# Patient Record
Sex: Male | Born: 1945
Health system: Southern US, Community
[De-identification: ages and names within clinical notes are randomized; demographics above are authoritative.]

## PROBLEM LIST (undated history)

## (undated) DIAGNOSIS — I714 Abdominal aortic aneurysm, without rupture, unspecified: Secondary | ICD-10-CM

## (undated) DIAGNOSIS — N200 Calculus of kidney: Secondary | ICD-10-CM

## (undated) DIAGNOSIS — M1990 Unspecified osteoarthritis, unspecified site: Secondary | ICD-10-CM

## (undated) DIAGNOSIS — I10 Essential (primary) hypertension: Secondary | ICD-10-CM

## (undated) DIAGNOSIS — K219 Gastro-esophageal reflux disease without esophagitis: Secondary | ICD-10-CM

## (undated) DIAGNOSIS — C61 Malignant neoplasm of prostate: Secondary | ICD-10-CM

## (undated) DIAGNOSIS — E785 Hyperlipidemia, unspecified: Secondary | ICD-10-CM

## (undated) HISTORY — PX: KNEE SURGERY: SHX244

## (undated) HISTORY — DX: Abdominal aortic aneurysm, without rupture, unspecified: I71.40

## (undated) HISTORY — PX: CHOLECYSTECTOMY: SHX55

## (undated) HISTORY — DX: Abdominal aortic aneurysm, without rupture: I71.4

## (undated) HISTORY — PX: OTHER SURGICAL HISTORY: SHX169

---

## 1997-09-28 ENCOUNTER — Ambulatory Visit (HOSPITAL_BASED_OUTPATIENT_CLINIC_OR_DEPARTMENT_OTHER): Admission: RE | Admit: 1997-09-28 | Discharge: 1997-09-28 | Payer: Self-pay | Admitting: General Surgery

## 2000-05-14 ENCOUNTER — Encounter: Payer: Self-pay | Admitting: Family Medicine

## 2000-05-14 ENCOUNTER — Encounter: Admission: RE | Admit: 2000-05-14 | Discharge: 2000-05-14 | Payer: Self-pay | Admitting: Family Medicine

## 2000-05-17 ENCOUNTER — Encounter: Admission: RE | Admit: 2000-05-17 | Discharge: 2000-05-17 | Payer: Self-pay | Admitting: Family Medicine

## 2000-05-17 ENCOUNTER — Encounter: Payer: Self-pay | Admitting: Family Medicine

## 2001-01-04 ENCOUNTER — Encounter: Payer: Self-pay | Admitting: General Surgery

## 2001-01-06 ENCOUNTER — Observation Stay (HOSPITAL_COMMUNITY): Admission: RE | Admit: 2001-01-06 | Discharge: 2001-01-07 | Payer: Self-pay | Admitting: General Surgery

## 2001-01-06 ENCOUNTER — Encounter (INDEPENDENT_AMBULATORY_CARE_PROVIDER_SITE_OTHER): Payer: Self-pay | Admitting: *Deleted

## 2001-01-06 ENCOUNTER — Encounter: Payer: Self-pay | Admitting: General Surgery

## 2004-12-17 ENCOUNTER — Encounter: Admission: RE | Admit: 2004-12-17 | Discharge: 2004-12-17 | Payer: Self-pay | Admitting: Family Medicine

## 2009-03-04 ENCOUNTER — Encounter: Admission: RE | Admit: 2009-03-04 | Discharge: 2009-03-04 | Payer: Self-pay | Admitting: Family Medicine

## 2010-06-17 ENCOUNTER — Inpatient Hospital Stay (HOSPITAL_BASED_OUTPATIENT_CLINIC_OR_DEPARTMENT_OTHER)
Admission: RE | Admit: 2010-06-17 | Discharge: 2010-06-17 | Disposition: A | Discharge status: Home / Self Care | Payer: Managed Care, Other (non HMO) | Source: Ambulatory Visit | Attending: Cardiology | Admitting: Cardiology

## 2010-06-17 DIAGNOSIS — R9439 Abnormal result of other cardiovascular function study: Secondary | ICD-10-CM | POA: Insufficient documentation

## 2010-06-17 DIAGNOSIS — R0789 Other chest pain: Secondary | ICD-10-CM | POA: Insufficient documentation

## 2010-06-19 NOTE — Procedures (Signed)
NAME:  Scott Hill, Scott Hill              ACCOUNT NO.:  [id number]  MEDICAL RECORD NO.:  [id number]            PATIENT TYPE:  LOCATION:                                 FACILITY:  PHYSICIAN:  Armanda Magic, M.D.          DATE OF BIRTH:  DATE OF PROCEDURE:  06/17/2010 DATE OF DISCHARGE:                           CARDIAC CATHETERIZATION   REFERRING PHYSICIAN:  Dr. Camillo Flaming  PROCEDURES:  Left heart catheterization, coronary angiography, and left ventriculography.  OPERATOR:  Armanda Magic, MD  INDICATIONS:  Chest pain and abnormal nuclear stress test.  COMPLICATIONS:  None.  INTRAVENOUS ACCESS:  Via right femoral artery 4-French sheath.  INTRAVENOUS MEDICATIONS:  Versed 2 mg and fentanyl 25 mcg.  This is a 65 year old male who presented with complaints of episodic chest pain usually lasting 10 minutes described as a toothache sensation for a second at a time along with dyspnea on exertion.  He was found to have a fixed defect in the inferior wall consistent with diaphragmatic attenuation and a small defect in the anterior wall.  It is felt most likely due to soft tissue attenuation, but due to the patient's continued chest pain he now presents for cardiac catheterization.  The patient was brought to Cardiac Catheterization Laboratory in a fasting nonsedated state.  Informed consent was obtained.  The patient was connected to continuous heart rate and pulse oximetry monitoring and intermittent blood pressure monitoring.  The right groin was prepped and draped in a sterile fashion.  Xylocaine 1% was used for local anesthesia.  Using modified Seldinger technique, a 4-French sheath was placed in the right femoral artery.  Under fluoroscopic guidance, a 4- [place] JL-4 catheter was placed in the ascending left coronary artery, but could not adequately engage the artery.  The catheter was exchanged out over a guidewire for a 4-French JL-5 catheter which successfully engaged the left  coronary ostium.  Multiple cine films taken at 30- degree RAO and 40-degree LAO views.  This catheter was exchanged out over a guidewire for a 4-French 3-DRCA catheter which successfully engaged the right coronary ostium..  Multiple cine films were taken at 30-degree RAO and 40-degree LAO views.  This catheter was exchanged out over a guidewire for a 4-French angled pigtail catheter which was placed under fluoroscopic guidance in the left ventricular cavity.  Left ventriculography was performed in 30-degree RAO view using a total of 25 mL of contrast at 12 mL per second.  The catheter was then pulled back across the aortic valve with no significant gradient noted.  At the end of the procedure, all catheters and sheaths were removed.  Manual compression was performed until adequate hemostasis was obtained.  The patient was transferred back to room in stable condition.  RESULTS:  The left main coronary artery is short and widely patent and trifurcates into left anterior descending artery, ramus branch, and left circumflex artery.  The left anterior descending artery is widely patent throughout its course of the apex giving rise to a large diagonal branch which is widely patent.  The ramus is a large vessel which is widely patent  throughout its course and distally bifurcates into 2 daughter branches both of which are widely patent.  The left circumflex is widely patent throughout its course in the AV groove.  It gives rise to a first small obtuse marginal branch which is widely patent and a second large obtuse marginal 2 branch which is widely patent and terminates in a third obtuse marginal branch which is widely patent.  The right coronary artery is widely patent and distally bifurcates into posterior descending artery and posterolateral artery both of which are widely patent.  Left ventriculography shows normal LV function, EF 60%, LV pressure was 33/50 mmHg, aortic pressure  130/72 mmHg, LVEDP 17 mmHg.  ASSESSMENT: 1. Normal coronary arteries. 2. Normal left ventricular function. 3. Noncardiac chest pain.  PLAN:  Discharge to home after IV fluid and bedrest are complete.  We will have the patient follow up with primary doctor for possible treatment of possible GERD symptoms since the patient says the symptoms seem to occur after meals.  He will follow up with my nurse practitioner in 2 weeks for groin check.     Armanda Magic, M.D.     TT/MEDQ  D:  06/17/2010  T:  06/18/2010  Job:  [id number]  Electronically Signed by Armanda Magic M.D. on 06/19/2010 09:33:45 PM

## 2010-08-15 NOTE — Op Note (Signed)
[facility]  Patient:    Scott Hill, Scott Hill Visit Number: [id number] MRN: [id number]          Service Type: SUR Location: [id number] Attending Physician:  Carson Myrtle Proc. Date: 01/06/01 Admit Date:  01/06/2001   CC:         Talmadge Coventry, M.D.   Operative Report  PREOPERATIVE DIAGNOSES: 1. Chronic cholecystolithiasis. 2. Hemangioma of the liver.  POSTOPERATIVE DIAGNOSES: 1. Chronic cholecystolithiasis. 2. Hemangioma of the liver.  OPERATION:  Laparoscopy, laparoscopic cholecystectomy, and operative cholangiogram.  SURGEON:  Timothy E. Earlene Plater, M.D.  ASSISTANT:  Currie Paris, M.D.  ANESTHESIA:  CRNA supervised Dr. Gladis Riffle.  INDICATIONS:  Scott Hill has had known gallstones since February 2002, with minimal symptoms initially and increasing symptoms recently.  Also, he is known to have hemangioma.  He underwent MRI study followed up by MRI and ultrasound of the liver lesions, and they are felt to be hemangioma.  Because of increasing symptoms, he has elected to proceed with laparoscopic cholecystectomy at this time.  DESCRIPTION OF PROCEDURE:  The patient was brought to the operating room, evaluated by anesthesia, taken to the operating room and placed supine. General endotracheal anesthesia administered.  The abdomen was shaved minimally, scrubbed, prepped and draped in the usual fashion.  Marcaine 0.25% with epinephrine was used prior to each incision.  A vertical infraumbilical incision made and the fascia identified, opened vertically, peritoneum entered without complications.  The Hasson catheter sewed in place with a #1 Vicryl through the fascia and the abdomen insufflated.  Peritoneoscopy was carried out.  No findings were made in the general abdomen.  The gallbladder was seen protruding slightly from the top of the liver.  A second 10 mm trocar was placed in the mid epigastrium, two 5 mm trocars in the right  upper quadrant. Appropriate instruments were applied.  The liver was examined, and the larger of the two hemangiomas was seen in the dome of the right lobe of the liver. Pictures were made.  The gallbladder was grasped, placed on tension.  It appeared to be acute or semi-acute, and then it was tense and edematous.  The fatty tissue about the gallbladder was carefully dissected, and the base of the gallbladder was clearly identified.  A single cystic duct was seen entering the gallbladder.  This was dissected free.  A clip was placed near the gallbladder, a small opening made in the cystic duct.  A cholangiogram catheter was passed percutaneously and inserted into the remnant of the cystic duct.  Then using real-time fluoroscopy, a cholangiogram was made showing normal rapid fill of dye into the biliary system and free flow of dye into the duodenum.  We did not see any abnormalities in the biliary tree and no foreign bodies.  I did ask Dr. Sherrie Mustache to review the real-time fluoroscopy.  He did, and agreed that there were no biliary tree abnormalities.  The cholangiogram catheter was removed, and then the remnant of the cystic duct was triply clipped.  It was fully divided.  There was slight tear from the mid portion of the liver where the gallbladder was primarily intrahepatic. The cystic artery at the base of the gallbladder had been clearly dissected and clipped.  It was further dissected now, triply clipped proximally and divided.  The gallbladder was removed from the gallbladder bed with some difficulty, it being intrahepatic.  Cautery was used fairly extensively.  And in the mid portion of the liver bed, there  was a significant bleeder that was identified, clipped, and thoroughly cauterized.  Two small holes were made in the gallbladder during this dissection, and there was leak of bile but no stones.  This was quickly suctioned away.  With satisfactory control of the bleeding in the bed  of the gallbladder, the remainder of the gallbladder was removed from the gallbladder bed.  A piece of Surgicel was placed there and held in place.  The gallbladder was put in the EndoCatch bag and removed from the abdomen.  Further surveillance of the liver bed revealed no evidence of further bleeding.  Copious irrigation was used (2 liters).  The irrigant was now clear.  Again inspection revealed no evidence of complications.  With this, the irrigant, CO2, instruments, and trocars were all removed from the abdomen.  Skin incisions closed with 3-0 Monocryl.  Steri-Strips and dry sterile dressing applied.  Counts correct.  He tolerated it well and was removed to the recovery room in good condition. Attending Physician:  Carson Myrtle DD:  01/06/01 TD:  01/06/01 Job: [id number]

## 2011-10-06 ENCOUNTER — Other Ambulatory Visit: Payer: Self-pay | Admitting: Orthopaedic Surgery

## 2011-11-13 NOTE — Pre-Procedure Instructions (Signed)
20 DEZI SCHANER  11/13/2011   Your procedure is scheduled on:  11-24-2011  Report to [facility] at 5:30 AM.  Call this number if you have problems the morning of surgery: [phone]   Remember:   Do not eat food or drink:After Midnight.      Take these medicines the morning of surgery with A SIP OF WATER: none   Do not wear jewelry, make-up or nail polish.  Do not wear lotions, powders, or perfumes. You may wear deodorant.  Do not shave 48 hours prior to surgery. Men may shave face and neck.  Do not bring valuables to the hospital.  Contacts, dentures or bridgework may not be worn into surgery.  Leave suitcase in the car. After surgery it may be brought to your room.  For patients admitted to the hospital, checkout time is 11:00 AM the day of discharge.      Special Instructions: Incentive Spirometry - Practice and bring it with you on the day of surgery. and CHG Shower Use Special Wash: 1/2 bottle night before surgery and 1/2 bottle morning of surgery.   Please read over the following fact sheets that you were given: Pain Booklet, Blood Transfusion Information, MRSA Information and Surgical Site Infection Prevention

## 2011-11-16 ENCOUNTER — Other Ambulatory Visit: Payer: Self-pay | Admitting: Orthopaedic Surgery

## 2011-11-16 ENCOUNTER — Encounter (HOSPITAL_COMMUNITY): Payer: Self-pay | Admitting: Respiratory Therapy

## 2011-11-16 ENCOUNTER — Encounter (HOSPITAL_COMMUNITY): Payer: Self-pay

## 2011-11-16 ENCOUNTER — Encounter (HOSPITAL_COMMUNITY)
Admission: RE | Admit: 2011-11-16 | Discharge: 2011-11-16 | Disposition: A | Discharge status: Home / Self Care | Payer: Managed Care, Other (non HMO) | Source: Ambulatory Visit | Attending: Orthopaedic Surgery | Admitting: Orthopaedic Surgery

## 2011-11-16 DIAGNOSIS — R9389 Abnormal findings on diagnostic imaging of other specified body structures: Secondary | ICD-10-CM

## 2011-11-16 HISTORY — DX: Hyperlipidemia, unspecified: E78.5

## 2011-11-16 HISTORY — DX: Gastro-esophageal reflux disease without esophagitis: K21.9

## 2011-11-16 HISTORY — DX: Unspecified osteoarthritis, unspecified site: M19.90

## 2011-11-16 HISTORY — DX: Calculus of kidney: N20.0

## 2011-11-16 HISTORY — DX: Essential (primary) hypertension: I10

## 2011-11-16 LAB — URINALYSIS, ROUTINE W REFLEX MICROSCOPIC
Bilirubin Urine: NEGATIVE
Leukocytes, UA: NEGATIVE
Nitrite: NEGATIVE
Specific Gravity, Urine: 1.01 (ref 1.005–1.030)
Urobilinogen, UA: 0.2 mg/dL (ref 0.0–1.0)

## 2011-11-16 LAB — APTT: aPTT: 28 seconds (ref 24–37)

## 2011-11-16 LAB — DIFFERENTIAL
Eosinophils Relative: 1 % (ref 0–5)
Lymphocytes Relative: 18 % (ref 12–46)
Lymphs Abs: 1.4 10*3/uL (ref 0.7–4.0)
Monocytes Absolute: 0.3 10*3/uL (ref 0.1–1.0)
Monocytes Relative: 4 % (ref 3–12)

## 2011-11-16 LAB — BASIC METABOLIC PANEL
CO2: 29 mEq/L (ref 19–32)
Chloride: 102 mEq/L (ref 96–112)
Glucose, Bld: 137 mg/dL — ABNORMAL HIGH (ref 70–99)
Sodium: 140 mEq/L (ref 135–145)

## 2011-11-16 LAB — CBC
HCT: 43.9 % (ref 39.0–52.0)
MCH: 30 pg (ref 26.0–34.0)
MCV: 89 fL (ref 78.0–100.0)
RBC: 4.93 MIL/uL (ref 4.22–5.81)
WBC: 7.6 10*3/uL (ref 4.0–10.5)

## 2011-11-16 LAB — TYPE AND SCREEN: Antibody Screen: NEGATIVE

## 2011-11-16 LAB — ABO/RH: ABO/RH(D): A POS

## 2011-11-17 ENCOUNTER — Other Ambulatory Visit (HOSPITAL_COMMUNITY): Payer: Managed Care, Other (non HMO)

## 2011-11-20 ENCOUNTER — Ambulatory Visit
Admission: RE | Admit: 2011-11-20 | Discharge: 2011-11-20 | Disposition: A | Discharge status: Home / Self Care | Payer: Managed Care, Other (non HMO) | Source: Ambulatory Visit | Attending: Orthopaedic Surgery | Admitting: Orthopaedic Surgery

## 2011-11-20 DIAGNOSIS — R9389 Abnormal findings on diagnostic imaging of other specified body structures: Secondary | ICD-10-CM

## 2011-11-20 MED ORDER — IOHEXOL 300 MG/ML  SOLN: 75.0000 mL | Freq: Once | INTRAMUSCULAR | Status: AC | PRN

## 2011-11-20 NOTE — H&P (Signed)
Scott Hill is an 66 y.o. male.   Chief Complaint: left knee pain HPI: Scott Hill has had at least 3 years of left knee pain.  His pain is worse when he walks and he is now having trouble sleeping at nighttime.  He is also having pain with activities of daily living that limit his ability to be mobile.  He has had several corticosteroid injections.  He has tried over-the-counter anti-inflammatory medications.  These measures have not been helpful.Radiographs:  X-rays were ordered, performed, and interpreted by me today included [20]:  Bone-on-bone end-stage DJD of the left knee.  We have discussed proceeding with a total knee replacement to help eliminate pain and improve function.  Past Medical History  Diagnosis Date  . Hypertension   . Kidney stones   . GERD (gastroesophageal reflux disease)     occasionally  . Arthritis   . Hyperlipemia     Past Surgical History  Procedure Date  . Knee surgery     cartledge removed  . Cholecystectomy   . Fatty tumors     begein,numerous    No family history on file. Social History:  reports that he has quit smoking. His smoking use included Cigarettes. He has a 30 pack-year smoking history. He does not have any smokeless tobacco history on file. He reports that he drinks about 6 ounces of alcohol per week. He reports that he does not use illicit drugs.  Allergies: No Known Allergies  No prescriptions prior to admission  medications: Simvastatin and HCTZ  No results found for this or any previous visit (from the past 48 hour(s)). No results found.  Review of Systems  Constitutional: Negative.   HENT: Negative.   Eyes: Negative.   Respiratory:       Currently undergoing evaluation for lung abnormality as seen on chest x-ray.  Cardiovascular: Negative.   Gastrointestinal: Negative.   Genitourinary: Negative.   Musculoskeletal: Negative.   Skin: Negative.   Neurological: Negative.   Endo/Heme/Allergies: Negative.     Psychiatric/Behavioral: Negative.     There were no vitals taken for this visit. Physical Exam  Constitutional: He appears well-nourished.  HENT:  Head: Atraumatic.  Eyes: EOM are normal.  Neck: Neck supple.  Cardiovascular: Regular rhythm.   Respiratory: Breath sounds normal.  GI: Soft.  Musculoskeletal:       He walks with a slightly altered gait.  Range of motion left knee 0-120.  No significant effusion.  There is significant crepitation to range of motion.  He does have pain medial and laterally on the joint line to palpation.  Good neurovascular status distally.  Neurological: He is alert.  Skin: Skin is warm.  Psychiatric: He has a normal mood and affect.     Assessment/Plan Left knee end-stage DJD Plan: I have reviewed the risk of anesthesia, infection, DVT and death related to this intervention.  We have discussed the hospital stay of 2-3 days and the necessity for postoperative physical therapy.  I have discussed with him the hope that the surgery will relieve his pain and improve his function.  Kasem Mozer R 11/20/2011, 1:00 PM

## 2011-11-23 MED ORDER — CEFAZOLIN SODIUM 10 G IJ SOLR
3.0000 g | INTRAMUSCULAR | Status: AC
  Administered 2011-11-24: 3 g via INTRAVENOUS
  Filled 2011-11-23 (×2): qty 3000

## 2011-11-24 ENCOUNTER — Encounter (HOSPITAL_COMMUNITY): Payer: Self-pay | Admitting: General Practice

## 2011-11-24 ENCOUNTER — Inpatient Hospital Stay (HOSPITAL_COMMUNITY): Payer: Managed Care, Other (non HMO) | Admitting: Anesthesiology

## 2011-11-24 ENCOUNTER — Encounter (HOSPITAL_COMMUNITY)
Admission: RE | Disposition: A | Discharge status: Home / Self Care | Payer: Self-pay | Source: Ambulatory Visit | Attending: Orthopaedic Surgery

## 2011-11-24 ENCOUNTER — Inpatient Hospital Stay (HOSPITAL_COMMUNITY)
Admission: RE | Admit: 2011-11-24 | Discharge: 2011-11-26 | DRG: 470 | Disposition: A | Discharge status: Home / Self Care | Payer: Managed Care, Other (non HMO) | Source: Ambulatory Visit | Attending: Orthopaedic Surgery | Admitting: Orthopaedic Surgery

## 2011-11-24 ENCOUNTER — Encounter (HOSPITAL_COMMUNITY): Payer: Self-pay | Admitting: Anesthesiology

## 2011-11-24 ENCOUNTER — Encounter (HOSPITAL_COMMUNITY): Payer: Self-pay | Admitting: *Deleted

## 2011-11-24 DIAGNOSIS — M1710 Unilateral primary osteoarthritis, unspecified knee: Principal | ICD-10-CM | POA: Diagnosis present

## 2011-11-24 DIAGNOSIS — M1712 Unilateral primary osteoarthritis, left knee: Secondary | ICD-10-CM | POA: Diagnosis present

## 2011-11-24 DIAGNOSIS — I10 Essential (primary) hypertension: Secondary | ICD-10-CM | POA: Diagnosis present

## 2011-11-24 DIAGNOSIS — E785 Hyperlipidemia, unspecified: Secondary | ICD-10-CM | POA: Diagnosis present

## 2011-11-24 DIAGNOSIS — M129 Arthropathy, unspecified: Secondary | ICD-10-CM | POA: Diagnosis present

## 2011-11-24 DIAGNOSIS — K219 Gastro-esophageal reflux disease without esophagitis: Secondary | ICD-10-CM | POA: Diagnosis present

## 2011-11-24 HISTORY — PX: TOTAL KNEE ARTHROPLASTY: SHX125

## 2011-11-24 SURGERY — ARTHROPLASTY, KNEE, TOTAL
Anesthesia: General | Site: Knee | Laterality: Left | Wound class: Clean

## 2011-11-24 MED ORDER — HYDROMORPHONE HCL PF 1 MG/ML IJ SOLN
0.2500 mg | INTRAMUSCULAR | Status: DC | PRN
  Administered 2011-11-24 (×3): 0.5 mg via INTRAVENOUS

## 2011-11-24 MED ORDER — LACTATED RINGERS IV SOLN: INTRAVENOUS | Status: DC

## 2011-11-24 MED ORDER — LIDOCAINE HCL (CARDIAC) 20 MG/ML IV SOLN
INTRAVENOUS | Status: DC | PRN
  Administered 2011-11-24: 100 mg via INTRAVENOUS

## 2011-11-24 MED ORDER — CEFUROXIME SODIUM 1.5 G IJ SOLR
INTRAMUSCULAR | Status: AC
  Filled 2011-11-24: qty 1.5

## 2011-11-24 MED ORDER — METHOCARBAMOL 100 MG/ML IJ SOLN
500.0000 mg | Freq: Four times a day (QID) | INTRAVENOUS | Status: DC | PRN
  Administered 2011-11-24: 500 mg via INTRAVENOUS
  Filled 2011-11-24: qty 5

## 2011-11-24 MED ORDER — PHENOL 1.4 % MT LIQD: 1.0000 | OROMUCOSAL | Status: DC | PRN

## 2011-11-24 MED ORDER — METOCLOPRAMIDE HCL 5 MG/ML IJ SOLN: 5.0000 mg | Freq: Three times a day (TID) | INTRAMUSCULAR | Status: DC | PRN

## 2011-11-24 MED ORDER — FENTANYL CITRATE 0.05 MG/ML IJ SOLN
INTRAMUSCULAR | Status: DC | PRN
  Administered 2011-11-24 (×2): 50 ug via INTRAVENOUS
  Administered 2011-11-24: 100 ug via INTRAVENOUS
  Administered 2011-11-24: 50 ug via INTRAVENOUS

## 2011-11-24 MED ORDER — ALUM & MAG HYDROXIDE-SIMETH 200-200-20 MG/5ML PO SUSP: 30.0000 mL | ORAL | Status: DC | PRN

## 2011-11-24 MED ORDER — HYDROMORPHONE HCL PF 1 MG/ML IJ SOLN
INTRAMUSCULAR | Status: AC
  Filled 2011-11-24: qty 1

## 2011-11-24 MED ORDER — MIDAZOLAM HCL 5 MG/5ML IJ SOLN
INTRAMUSCULAR | Status: DC | PRN
  Administered 2011-11-24: 2 mg via INTRAVENOUS

## 2011-11-24 MED ORDER — PHENYLEPHRINE HCL 10 MG/ML IJ SOLN
INTRAMUSCULAR | Status: DC | PRN
  Administered 2011-11-24: 160 ug via INTRAVENOUS
  Administered 2011-11-24 (×3): 80 ug via INTRAVENOUS

## 2011-11-24 MED ORDER — ONDANSETRON HCL 4 MG/2ML IJ SOLN: 4.0000 mg | Freq: Four times a day (QID) | INTRAMUSCULAR | Status: DC | PRN

## 2011-11-24 MED ORDER — METHOCARBAMOL 500 MG PO TABS
500.0000 mg | ORAL_TABLET | Freq: Four times a day (QID) | ORAL | Status: DC | PRN
  Administered 2011-11-24 – 2011-11-25 (×4): 500 mg via ORAL
  Filled 2011-11-24 (×5): qty 1

## 2011-11-24 MED ORDER — ASPIRIN EC 325 MG PO TBEC
325.0000 mg | DELAYED_RELEASE_TABLET | Freq: Two times a day (BID) | ORAL | Status: DC
  Administered 2011-11-25 – 2011-11-26 (×3): 325 mg via ORAL
  Filled 2011-11-24 (×5): qty 1

## 2011-11-24 MED ORDER — CHLORHEXIDINE GLUCONATE 4 % EX LIQD: 60.0000 mL | Freq: Once | CUTANEOUS | Status: DC

## 2011-11-24 MED ORDER — ONDANSETRON HCL 4 MG PO TABS: 4.0000 mg | ORAL_TABLET | Freq: Four times a day (QID) | ORAL | Status: DC | PRN

## 2011-11-24 MED ORDER — LOSARTAN POTASSIUM-HCTZ 100-25 MG PO TABS: 1.0000 | ORAL_TABLET | Freq: Every day | ORAL | Status: DC

## 2011-11-24 MED ORDER — HYDROCHLOROTHIAZIDE 25 MG PO TABS
25.0000 mg | ORAL_TABLET | Freq: Every day | ORAL | Status: DC
  Administered 2011-11-24 – 2011-11-26 (×3): 25 mg via ORAL
  Filled 2011-11-24 (×3): qty 1

## 2011-11-24 MED ORDER — SENNOSIDES-DOCUSATE SODIUM 8.6-50 MG PO TABS: 1.0000 | ORAL_TABLET | Freq: Every evening | ORAL | Status: DC | PRN

## 2011-11-24 MED ORDER — DIPHENHYDRAMINE HCL 12.5 MG/5ML PO ELIX: 12.5000 mg | ORAL_SOLUTION | ORAL | Status: DC | PRN

## 2011-11-24 MED ORDER — BUPIVACAINE-EPINEPHRINE PF 0.5-1:200000 % IJ SOLN
INTRAMUSCULAR | Status: DC | PRN
  Administered 2011-11-24: 30 mL

## 2011-11-24 MED ORDER — LACTATED RINGERS IV SOLN
INTRAVENOUS | Status: DC | PRN
  Administered 2011-11-24 (×2): via INTRAVENOUS

## 2011-11-24 MED ORDER — ONDANSETRON HCL 4 MG/2ML IJ SOLN
INTRAMUSCULAR | Status: DC | PRN
  Administered 2011-11-24: 4 mg via INTRAVENOUS

## 2011-11-24 MED ORDER — FLEET ENEMA 7-19 GM/118ML RE ENEM: 1.0000 | ENEMA | Freq: Once | RECTAL | Status: AC | PRN

## 2011-11-24 MED ORDER — METOCLOPRAMIDE HCL 10 MG PO TABS: 5.0000 mg | ORAL_TABLET | Freq: Three times a day (TID) | ORAL | Status: DC | PRN

## 2011-11-24 MED ORDER — ACETAMINOPHEN 10 MG/ML IV SOLN
INTRAVENOUS | Status: DC | PRN
  Administered 2011-11-24: 1000 mg via INTRAVENOUS

## 2011-11-24 MED ORDER — CEFUROXIME SODIUM 1.5 G IJ SOLR
INTRAMUSCULAR | Status: DC | PRN
  Administered 2011-11-24: 1.5 g

## 2011-11-24 MED ORDER — ACETAMINOPHEN 10 MG/ML IV SOLN
INTRAVENOUS | Status: AC
  Filled 2011-11-24: qty 100

## 2011-11-24 MED ORDER — ONDANSETRON HCL 4 MG/2ML IJ SOLN: 4.0000 mg | Freq: Once | INTRAMUSCULAR | Status: DC | PRN

## 2011-11-24 MED ORDER — HYDROMORPHONE HCL PF 1 MG/ML IJ SOLN
0.5000 mg | INTRAMUSCULAR | Status: DC | PRN
  Administered 2011-11-24 – 2011-11-25 (×5): 1 mg via INTRAVENOUS
  Filled 2011-11-24 (×4): qty 1

## 2011-11-24 MED ORDER — DOCUSATE SODIUM 100 MG PO CAPS
100.0000 mg | ORAL_CAPSULE | Freq: Two times a day (BID) | ORAL | Status: DC
  Administered 2011-11-24 – 2011-11-26 (×5): 100 mg via ORAL
  Filled 2011-11-24 (×5): qty 1

## 2011-11-24 MED ORDER — PROPOFOL 10 MG/ML IV EMUL
INTRAVENOUS | Status: DC | PRN
  Administered 2011-11-24: 200 mg via INTRAVENOUS

## 2011-11-24 MED ORDER — LOSARTAN POTASSIUM 50 MG PO TABS
100.0000 mg | ORAL_TABLET | Freq: Every day | ORAL | Status: DC
  Administered 2011-11-24 – 2011-11-26 (×3): 100 mg via ORAL
  Filled 2011-11-24 (×3): qty 2

## 2011-11-24 MED ORDER — OXYCODONE-ACETAMINOPHEN 5-325 MG PO TABS
1.0000 | ORAL_TABLET | ORAL | Status: DC | PRN
  Administered 2011-11-24 – 2011-11-26 (×6): 2 via ORAL
  Filled 2011-11-24 (×6): qty 2

## 2011-11-24 MED ORDER — CEFAZOLIN SODIUM-DEXTROSE 2-3 GM-% IV SOLR
2.0000 g | Freq: Four times a day (QID) | INTRAVENOUS | Status: AC
  Administered 2011-11-24 (×2): 2 g via INTRAVENOUS
  Filled 2011-11-24 (×2): qty 50

## 2011-11-24 MED ORDER — MENTHOL 3 MG MT LOZG: 1.0000 | LOZENGE | OROMUCOSAL | Status: DC | PRN

## 2011-11-24 MED ORDER — GLYCOPYRROLATE 0.2 MG/ML IJ SOLN
INTRAMUSCULAR | Status: DC | PRN
  Administered 2011-11-24: 0.2 mg via INTRAVENOUS

## 2011-11-24 MED ORDER — SODIUM CHLORIDE 0.9 % IR SOLN
Status: DC | PRN
  Administered 2011-11-24: 3000 mL

## 2011-11-24 MED ORDER — ACETAMINOPHEN 650 MG RE SUPP: 650.0000 mg | Freq: Four times a day (QID) | RECTAL | Status: DC | PRN

## 2011-11-24 MED ORDER — ACETAMINOPHEN 325 MG PO TABS: 650.0000 mg | ORAL_TABLET | Freq: Four times a day (QID) | ORAL | Status: DC | PRN

## 2011-11-24 MED ORDER — SIMVASTATIN 40 MG PO TABS
40.0000 mg | ORAL_TABLET | Freq: Every evening | ORAL | Status: DC
  Administered 2011-11-24 – 2011-11-25 (×2): 40 mg via ORAL
  Filled 2011-11-24 (×3): qty 1

## 2011-11-24 MED ORDER — LACTATED RINGERS IV SOLN
INTRAVENOUS | Status: DC
  Administered 2011-11-24: 13:00:00 via INTRAVENOUS

## 2011-11-24 SURGICAL SUPPLY — 60 items
AUTOTRANSFUSION W/QD PVC DRAIN (AUTOTRANSFUSION) ×2 IMPLANT
BANDAGE ELASTIC 4 VELCRO ST LF (GAUZE/BANDAGES/DRESSINGS) ×2 IMPLANT
BANDAGE ESMARK 6X9 LF (GAUZE/BANDAGES/DRESSINGS) ×1 IMPLANT
BANDAGE GAUZE ELAST BULKY 4 IN (GAUZE/BANDAGES/DRESSINGS) ×2 IMPLANT
BLADE SAGITTAL 25.0X1.19X90 (BLADE) ×2 IMPLANT
BLADE SURG ROTATE 9660 (MISCELLANEOUS) IMPLANT
BNDG ELASTIC 6X10 VLCR STRL LF (GAUZE/BANDAGES/DRESSINGS) ×2 IMPLANT
BNDG ESMARK 6X9 LF (GAUZE/BANDAGES/DRESSINGS) ×2
BOWL SMART MIX CTS (DISPOSABLE) ×2 IMPLANT
CEMENT HV SMART SET (Cement) ×4 IMPLANT
CLOTH BEACON ORANGE TIMEOUT ST (SAFETY) ×2 IMPLANT
COVER SURGICAL LIGHT HANDLE (MISCELLANEOUS) ×2 IMPLANT
CUFF TOURNIQUET SINGLE 34IN LL (TOURNIQUET CUFF) ×2 IMPLANT
CUFF TOURNIQUET SINGLE 44IN (TOURNIQUET CUFF) IMPLANT
DRAPE EXTREMITY T 121X128X90 (DRAPE) ×2 IMPLANT
DRAPE PROXIMA HALF (DRAPES) ×2 IMPLANT
DRAPE U-SHAPE 47X51 STRL (DRAPES) ×2 IMPLANT
DRSG ADAPTIC 3X8 NADH LF (GAUZE/BANDAGES/DRESSINGS) ×2 IMPLANT
DRSG PAD ABDOMINAL 8X10 ST (GAUZE/BANDAGES/DRESSINGS) ×2 IMPLANT
DURAPREP 26ML APPLICATOR (WOUND CARE) ×2 IMPLANT
ELECT REM PT RETURN 9FT ADLT (ELECTROSURGICAL) ×2
ELECTRODE REM PT RTRN 9FT ADLT (ELECTROSURGICAL) ×1 IMPLANT
EVACUATOR 1/8 PVC DRAIN (DRAIN) ×2 IMPLANT
FACESHIELD LNG OPTICON STERILE (SAFETY) ×4 IMPLANT
GLOVE BIO SURGEON STRL SZ8.5 (GLOVE) ×2 IMPLANT
GLOVE BIOGEL PI IND STRL 8 (GLOVE) ×1 IMPLANT
GLOVE BIOGEL PI IND STRL 8.5 (GLOVE) ×1 IMPLANT
GLOVE BIOGEL PI INDICATOR 8 (GLOVE) ×1
GLOVE BIOGEL PI INDICATOR 8.5 (GLOVE) ×1
GLOVE SS BIOGEL STRL SZ 8 (GLOVE) ×1 IMPLANT
GLOVE SUPERSENSE BIOGEL SZ 8 (GLOVE) ×1
GOWN PREVENTION PLUS XLARGE (GOWN DISPOSABLE) ×2 IMPLANT
GOWN PREVENTION PLUS XXLARGE (GOWN DISPOSABLE) ×2 IMPLANT
GOWN STRL NON-REIN LRG LVL3 (GOWN DISPOSABLE) ×2 IMPLANT
HANDPIECE INTERPULSE COAX TIP (DISPOSABLE) ×1
HOOD PEEL AWAY FACE SHEILD DIS (HOOD) ×2 IMPLANT
IMMOBILIZER KNEE 20 (SOFTGOODS)
IMMOBILIZER KNEE 20 THIGH 36 (SOFTGOODS) IMPLANT
IMMOBILIZER KNEE 22 UNIV (SOFTGOODS) ×2 IMPLANT
IMMOBILIZER KNEE 24 THIGH 36 (MISCELLANEOUS) IMPLANT
IMMOBILIZER KNEE 24 UNIV (MISCELLANEOUS)
KIT BASIN OR (CUSTOM PROCEDURE TRAY) ×2 IMPLANT
KIT ROOM TURNOVER OR (KITS) ×2 IMPLANT
MANIFOLD NEPTUNE II (INSTRUMENTS) ×2 IMPLANT
NS IRRIG 1000ML POUR BTL (IV SOLUTION) ×2 IMPLANT
PACK TOTAL JOINT (CUSTOM PROCEDURE TRAY) ×2 IMPLANT
PAD ARMBOARD 7.5X6 YLW CONV (MISCELLANEOUS) ×4 IMPLANT
SET HNDPC FAN SPRY TIP SCT (DISPOSABLE) ×1 IMPLANT
SPONGE GAUZE 4X4 12PLY (GAUZE/BANDAGES/DRESSINGS) ×2 IMPLANT
STAPLER VISISTAT 35W (STAPLE) ×2 IMPLANT
SUCTION FRAZIER TIP 10 FR DISP (SUCTIONS) IMPLANT
SUT VIC AB 0 CT1 27 (SUTURE) ×2
SUT VIC AB 0 CT1 27XBRD ANBCTR (SUTURE) ×2 IMPLANT
SUT VIC AB 2-0 CT1 27 (SUTURE) ×2
SUT VIC AB 2-0 CT1 TAPERPNT 27 (SUTURE) ×2 IMPLANT
SUT VLOC 180 0 24IN GS25 (SUTURE) ×2 IMPLANT
TOWEL OR 17X24 6PK STRL BLUE (TOWEL DISPOSABLE) ×2 IMPLANT
TOWEL OR 17X26 10 PK STRL BLUE (TOWEL DISPOSABLE) ×2 IMPLANT
TRAY FOLEY CATH 14FR (SET/KITS/TRAYS/PACK) ×2 IMPLANT
WATER STERILE IRR 1000ML POUR (IV SOLUTION) ×6 IMPLANT

## 2011-11-24 NOTE — Anesthesia Procedure Notes (Addendum)
Anesthesia Regional Block:  Femoral nerve block  Pre-Anesthetic Checklist: ,, timeout performed, Correct Patient, Correct Site, Correct Laterality, Correct Procedure, Correct Position, site marked, Risks and benefits discussed,  Surgical consent,  Pre-op evaluation,  At surgeon's request and post-op pain management  Laterality: Left and Lower  Prep: chloraprep       Needles:  Injection technique: Single-shot  Needle Type: Echogenic Needle     Needle Length: 9cm  Needle Gauge: 21    Additional Needles:  Procedures: ultrasound guided Femoral nerve block Narrative:  Start time: 11/24/2011 6:57 AM End time: 11/24/2011 7:10 AM Injection made incrementally with aspirations every 5 mL.  Performed by: Personally  Anesthesiologist: Sheldon Silvan, MD  Additional Notes: Marcaine 0.5% with EPI 1:200000  Femoral nerve block Procedure Name: LMA Insertion Date/Time: 11/24/2011 7:42 AM Performed by: Arlice Colt B Pre-anesthesia Checklist: Patient identified, Emergency Drugs available, Suction available, Patient being monitored and Timeout performed Patient Re-evaluated:Patient Re-evaluated prior to inductionOxygen Delivery Method: Circle system utilized Intubation Type: IV induction Ventilation: Mask ventilation without difficulty LMA: LMA inserted LMA Size: 5.0 Number of attempts: 2 Placement Confirmation: positive ETCO2 and breath sounds checked- equal and bilateral Tube secured with: Tape Dental Injury: Teeth and Oropharynx as per pre-operative assessment

## 2011-11-24 NOTE — Transfer of Care (Signed)
Immediate Anesthesia Transfer of Care Note  Patient: Scott Hill  Procedure(s) Performed: Procedure(s) (LRB): TOTAL KNEE ARTHROPLASTY (Left)  Patient Location: PACU  Anesthesia Type: General  Level of Consciousness: awake, alert  and oriented  Airway & Oxygen Therapy: Patient Spontanous Breathing and Patient connected to nasal cannula oxygen  Post-op Assessment: Report given to PACU RN and Post -op Vital signs reviewed and stable  Post vital signs: Reviewed and stable  Complications: No apparent anesthesia complications

## 2011-11-24 NOTE — Interval H&P Note (Signed)
History and Physical Interval Note:  11/24/2011 7:29 AM  Scott Hill  has presented today for surgery, with the diagnosis of LEFT KNEE DEGENERATIVE JOINT DISEASE  The various methods of treatment have been discussed with the patient and family. After consideration of risks, benefits and other options for treatment, the patient has consented to  Procedure(s) (LRB): TOTAL KNEE ARTHROPLASTY (Left) as a surgical intervention .  The patient's history has been reviewed, patient examined, no change in status, stable for surgery.  I have reviewed the patient's chart and labs.  Questions were answered to the patient's satisfaction.     Florina Glas G

## 2011-11-24 NOTE — Progress Notes (Signed)
UR COMPLETED  

## 2011-11-24 NOTE — Anesthesia Preprocedure Evaluation (Signed)

## 2011-11-24 NOTE — Anesthesia Postprocedure Evaluation (Signed)
Anesthesia Post Note  Patient: Scott Hill  Procedure(s) Performed: Procedure(s) (LRB): TOTAL KNEE ARTHROPLASTY (Left)  Anesthesia type: general  Patient location: PACU  Post pain: Pain level controlled  Post assessment: Patient's Cardiovascular Status Stable  Last Vitals:  Filed Vitals:   11/24/11 1115  BP:   Pulse: 63  Temp:   Resp: 15    Post vital signs: Reviewed and stable  Level of consciousness: sedated  Complications: No apparent anesthesia complications

## 2011-11-24 NOTE — Op Note (Signed)
PREOP DIAGNOSIS: DJD LEFT KNEE POSTOP DIAGNOSIS: DJD LEFT KNEE PROCEDURE: LEFT TKR ANESTHESIA: General and block ATTENDING SURGEON: Idus Rathke G ASSISTANT: Lindwood Qua PA  INDICATIONS FOR PROCEDURE: Scott Hill is a 66 y.o. male who has struggled for a long time with pain due to degenerative arthritis of the left knee.  The patient has failed many conservative non-operative measures and at this point has pain which limits the ability to sleep and walk.  The patient is offered total knee replacement.  Informed operative consent was obtained after discussion of possible risks of anesthesia, infection, neurovascular injury, DVT, and death.  The importance of the post-operative rehabilitation protocol to optimize result was stressed extensively with the patient.  SUMMARY OF FINDINGS AND PROCEDURE:  Scott Hill was taken to the operative suite where under the above anesthesia a left knee replacement was performed.  There were advanced degenerative changes and the bone quality was excellent.  We used the DePuy system and placed size large femur, tibia, 38mm all polyethylene patella, and a size 10mm spacer.  I did include Zinacef antibiotic in the cement.  The patient was admitted for appropriate post-op care to include perioperative antibiotics and mechanical and pharmacologic measures for DVT prophylaxis.  DESCRIPTION OF PROCEDURE:  Scott Hill was taken to the operative suite where the above anesthesia was applied.  The patient was positioned supine and prepped and draped in normal sterile fashion.  An appropriate time out was performed.  After the administration of Kefzol pre-op antibiotic a standard longitudinal incision was made on the anterior knee.  Dissection was carried down to the extensor mechanism.  All appropriate anti-infective measures were used including the pre-operative antibiotic, betadine impregnated drape, and closed hooded exhaust systems for each member of the  surgical team.  A medial parapatellar incision was made in the extensor mechanism and the knee cap flipped and the knee flexed.  Some residual meniscal tissues were removed along with any remaining ACL/PCL tissue.  A guide was placed on the tibia and a flat cut was made on it's superior surface.  An intramedullary guide was placed in the femur and was utilized to make anterior and posterior cuts creating an appropriate flexion gap.  A second intramedullary guide was placed in the femur to make a distal cut properly balancing the knee with an extension gap equal to the flexion gap.  The three bones sized to the above mentioned sizes and the appropriate guides were placed and utilized.  A trial reduction was done and the knee easily came to full extension and the patella tracked well on flexion.  The trial components were removed and all bones were cleaned with pulsatile lavage and then dried thoroughly.  Cement was mixed including antibiotic and was pressurized onto the bones followed by placement of the aforementioned components.  Excess cement was trimmed and pressure was held on the components until the cement had hardened.  The tourniquet was deflated and a small amount of bleeding was controlled with cautery and pressure.  The knee was irrigated thoroughly.  The extensor mechanism was re-approximated with V-loc suture in running fashion.  The knee was flexed and the repair was solid.  The subcutaneous tissues were re-approximated with #0 and #2-0 vicryl and the skin closed with staples.  A sterile dressing was applied.  Intraoperative fluids, EBL, and tourniquet time can be obtained from anesthesia records.  DISPOSITION:  The patient was taken to recovery room in stable condition and admitted for appropriate  post-op care to include peri-operative antibiotic and DVT prophylaxis with mechanical and pharmacologic measures.  Deepak Bless G 11/24/2011, 9:25 AM

## 2011-11-24 NOTE — Progress Notes (Signed)
Orthopedic Tech Progress Note Patient Details:  Scott Hill 1945/09/13 [id number] CPM applied to Left LE with appropriate settings; OHF with trapeze bar applied to bed CPM Left Knee CPM Left Knee: On Left Knee Flexion (Degrees): 60  Left Knee Extension (Degrees): 0    Asia R Thompson 11/24/2011, 10:40 AM

## 2011-11-25 ENCOUNTER — Encounter (HOSPITAL_COMMUNITY): Payer: Self-pay | Admitting: Orthopaedic Surgery

## 2011-11-25 LAB — CBC
Hemoglobin: 12.5 g/dL — ABNORMAL LOW (ref 13.0–17.0)
MCH: 30.3 pg (ref 26.0–34.0)
MCV: 91.3 fL (ref 78.0–100.0)
RBC: 4.12 MIL/uL — ABNORMAL LOW (ref 4.22–5.81)
WBC: 7.7 10*3/uL (ref 4.0–10.5)

## 2011-11-25 LAB — BASIC METABOLIC PANEL
CO2: 30 mEq/L (ref 19–32)
Calcium: 8.9 mg/dL (ref 8.4–10.5)
Chloride: 97 mEq/L (ref 96–112)
Glucose, Bld: 146 mg/dL — ABNORMAL HIGH (ref 70–99)
Sodium: 133 mEq/L — ABNORMAL LOW (ref 135–145)

## 2011-11-25 NOTE — Progress Notes (Signed)
Orthopedic Tech Progress Note Patient Details:  Scott Hill December 04, 1945 [id number]  Patient ID: [name], male   DOB: January 22, 1946, 66 y.o.   MRN: [id number]   [name] 11/25/2011, 3:26 PM Applied by Corinna Gab

## 2011-11-25 NOTE — Evaluation (Addendum)
Physical Therapy Evaluation Patient Details Name: Scott Hill MRN: [id number] DOB: Sep 10, 1945 Today's Date: 11/25/2011 Time:  -     PT Assessment / Plan / Recommendation Clinical Impression  66 y.o. male admitted to [facility] for L TKA, WBAT, no knee brace.  He is POD #1 and is progressing well with gait.       PT Assessment  Patient needs continued PT services    Follow Up Recommendations  Home health PT;Supervision - Intermittent    Barriers to Discharge        Equipment Recommendations  None recommended by PT    Recommendations for Other Services     Frequency 7X/week    Precautions / Restrictions Precautions Precautions: Knee Restrictions LLE Weight Bearing: Weight bearing as tolerated   Pertinent Vitals/Pain Reports 8/10 pain, just received pain meds      Mobility  Transfers Sit to Stand: 5: Supervision Stand to Sit: 5: Supervision Details for Transfer Assistance: supervision for safety  Ambulation/Gait Ambulation/Gait Assistance: 4: Min guard Ambulation Distance (Feet): 80 Feet Assistive device: Rolling walker Ambulation/Gait Assistance Details: min guard assist for several small LOB during gait Gait Pattern: Step-to pattern;Antalgic    Exercises Total Joint Exercises Ankle Circles/Pumps: AROM;Both;10 reps;Supine Quad Sets: AROM;Left;10 reps;Supine Heel Slides: AAROM;Left;10 reps;Supine Hip ABduction/ADduction: AAROM;Left;10 reps;Supine Straight Leg Raises: AAROM;Left;10 reps;Supine   PT Diagnosis: Difficulty walking;Abnormality of gait;Generalized weakness;Acute pain  PT Problem List: Decreased strength;Decreased range of motion;Decreased activity tolerance;Decreased balance;Decreased mobility;Decreased knowledge of use of DME;Pain PT Treatment Interventions: DME instruction;Gait training;Stair training;Functional mobility training;Therapeutic activities;Therapeutic exercise;Balance training;Neuromuscular re-education;Patient/family education   PT  Goals Acute Rehab PT Goals PT Goal Formulation: With patient Time For Goal Achievement: 12/02/11 Potential to Achieve Goals: Good Pt will go Supine/Side to Sit: with modified independence PT Goal: Supine/Side to Sit - Progress: Goal set today Pt will go Sit to Supine/Side: with modified independence PT Goal: Sit to Supine/Side - Progress: Goal set today Pt will go Sit to Stand: with supervision PT Goal: Sit to Stand - Progress: Goal set today Pt will go Stand to Sit: with supervision PT Goal: Stand to Sit - Progress: Goal set today Pt will Ambulate: >150 feet;with supervision;with rolling walker PT Goal: Ambulate - Progress: Goal set today Pt will Go Up / Down Stairs: 1-2 stairs;with min assist;with least restrictive assistive device;with rolling walker PT Goal: Up/Down Stairs - Progress: Goal set today Pt will Perform Home Exercise Program: with supervision, verbal cues required/provided PT Goal: Perform Home Exercise Program - Progress: Goal set today  Visit Information  Last PT Received On: 11/25/11 Assistance Needed: +1    Subjective Data      Prior Functioning  Home Living Lives With: Spouse [name]) Available Help at Discharge: Family;Available 24 hours/day (wife is retired) Type of Home: House Home Access: Stairs to enter Secretary/administrator of Steps: 2 Entrance Stairs-Rails: None Home Layout: One level Bathroom Shower/Tub: Walk-in shower;Door [employer] Toilet: Handicapped height Home Adaptive Equipment: Engineer, drilling - rolling;Crutches Prior Function Level of Independence: Independent Able to Take Stairs?: Yes Driving: Yes Vocation: Full time employment Comments: comm[place] via plane to [place] every week Dominant Hand: Left    Cognition  Overall Cognitive Status: Appears within functional limits for tasks assessed/performed    Extremity/Trunk Assessment     Balance    End of Session PT - End of Session Activity Tolerance: Patient limited by  fatigue;Patient limited by pain Patient left: in chair;with call bell/phone within reach;with family/visitor present Nurse Communication:  (ready for discharge  home with wife)  GP     Lurena Joiner B. Alexio Sroka, PT, DPT [phone]   11/25/2011, 12:40 PM

## 2011-11-25 NOTE — Progress Notes (Signed)
Subjective: 1 Day Post-Op Procedure(s) (LRB): TOTAL KNEE ARTHROPLASTY (Left)  Activity level:  Up with pt  Diet tolerance:  eating Voiding:  ok Patient reports pain as 3 on 0-10 scale.    Objective: Vital signs in last 24 hours: Temp:  [97 F (36.1 C)-98.6 F (37 C)] 98.6 F (37 C) (08/27 2100) Pulse Rate:  [58-90] 67  (08/27 2100) Resp:  [9-21] 18  (08/27 2100) BP: (104-133)/(53-78) 104/53 mmHg (08/27 2100) SpO2:  [93 %-100 %] 99 % (08/27 2100) Weight:  [106.595 kg (235 lb)] 106.595 kg (235 lb) (08/27 1400)  Labs:  Basename 11/25/11 0600  HGB 12.5*    Basename 11/25/11 0600  WBC 7.7  RBC 4.12*  HCT 37.6*  PLT 181    Basename 11/25/11 0600  NA 133*  K 3.5  CL 97  CO2 30  BUN 15  CREATININE 1.19  GLUCOSE 146*  CALCIUM 8.9   No results found for this basename: LABPT:2,INR:2 in the last 72 hours  Physical Exam:  Neurologically intact ABD soft Neurovascular intact Sensation intact distally Intact pulses distally Dorsiflexion/Plantar flexion intact Incision: dressing C/D/I No cellulitis present Compartment soft  Assessment/Plan:  1 Day Post-Op Procedure(s) (LRB): TOTAL KNEE ARTHROPLASTY (Left) Advance diet Up with therapy D/C IV fluids Plan for discharge tomorrow with [facility] therapy    Scott Hill R 11/25/2011, 8:14 AM

## 2011-11-25 NOTE — Anesthesia Postprocedure Evaluation (Signed)
  Anesthesia Post-op Note  Patient: Scott Hill  Procedure(s) Performed: Procedure(s) (LRB): TOTAL KNEE ARTHROPLASTY (Left)  Patient Location: PACU  Anesthesia Type: GA combined with regional for post-op pain  Level of Consciousness: awake, alert  and oriented  Airway and Oxygen Therapy: Patient Spontanous Breathing and Patient connected to nasal cannula oxygen  Post-op Pain: mild  Post-op Assessment: Post-op Vital signs reviewed  Post-op Vital Signs: Reviewed  Complications: No apparent anesthesia complications

## 2011-11-25 NOTE — Evaluation (Signed)
Occupational Therapy Evaluation Patient Details Name: Scott Hill MRN: [id number] DOB: 1945-12-10 Today's Date: 11/25/2011 Time: 4098-1191 OT Time Calculation (min): 22 min  OT Assessment / Plan / Recommendation Clinical Impression  Pt s/p Lt TKA and presents with pain, decr balance as well as decr knowledge of DME, AE and precautions impacting PLOF. Pt will benefit from skilled OT in the acute setting to maximize I with ADL and ADL mobility prior to d/c    OT Assessment  Patient needs continued OT Services    Follow Up Recommendations  No OT follow up;Supervision/Assistance - 24 hour    Barriers to Discharge      Equipment Recommendations  3 in 1 bedside comode    Recommendations for Other Services    Frequency  Min 2X/week    Precautions / Restrictions Precautions Precautions: Knee Restrictions LLE Weight Bearing: Weight bearing as tolerated   Pertinent Vitals/Pain Pt reports 7/10 left knee pain with activity; pre-medicated and repositioned for pain relief    ADL  Eating/Feeding: Simulated;Independent Where Assessed - Eating/Feeding: Chair Grooming: Performed;Min guard Where Assessed - Grooming: Supported standing (hold to sink for balance) Upper Body Bathing: Set up Where Assessed - Upper Body Bathing: Unsupported sitting Lower Body Bathing: Simulated;Minimal assistance Where Assessed - Lower Body Bathing: Unsupported sit to stand Lower Body Dressing: Moderate assistance;Simulated Where Assessed - Lower Body Dressing: Unsupported sit to stand Toilet Transfer: Performed;Min guard Toilet Transfer Method: Sit to Barista: Regular height toilet;Grab bars Toileting - Clothing Manipulation and Hygiene: Performed;Min guard Where Assessed - Engineer, mining and Hygiene: Standing Tub/Shower Transfer: Performed;Minimal assistance Tub/Shower Transfer Method: Ambulating (backward entry) Psychologist, educational: Walk in  shower Equipment Used: Gait belt Transfers/Ambulation Related to ADLs: Pt Min guard A with ambulation throughout room despite moderate amount of pain ADL Comments: Pt and wife educated on AE use for LB dsg/bathing as well as 3n1 use for toilet and shower.     OT Diagnosis: Generalized weakness;Acute pain  OT Problem List: Decreased activity tolerance;Impaired balance (sitting and/or standing);Decreased knowledge of use of DME or AE;Pain;Decreased knowledge of precautions OT Treatment Interventions: Self-care/ADL training;DME and/or AE instruction;Therapeutic activities;Patient/family education;Balance training   OT Goals Acute Rehab OT Goals OT Goal Formulation: With patient/family Time For Goal Achievement: 12/02/11 Potential to Achieve Goals: Good ADL Goals Pt Will Perform Grooming: with modified independence;Standing at sink;Sitting at sink ADL Goal: Grooming - Progress: Goal set today Pt Will Perform Lower Body Bathing: with supervision;Sit to stand in shower ADL Goal: Lower Body Bathing - Progress: Goal set today Pt Will Perform Lower Body Dressing: with min assist;Sit to stand from bed;Sit to stand from chair ADL Goal: Lower Body Dressing - Progress: Goal set today Pt Will Transfer to Toilet: with modified independence;Ambulation;with DME;3-in-1 ADL Goal: Toilet Transfer - Progress: Goal set today Pt Will Perform Toileting - Clothing Manipulation: with modified independence;Standing ADL Goal: Toileting - Clothing Manipulation - Progress: Goal set today Pt Will Perform Tub/Shower Transfer: Shower transfer;with supervision;Ambulation;with DME ADL Goal: Tub/Shower Transfer - Progress: Goal set today  Visit Information  Last OT Received On: 11/25/11 Assistance Needed: +1    Subjective Data  Subjective: Oh you're here to torture me! Patient Stated Goal: Return home with wife   Prior Functioning  Vision/Perception  Home Living Lives With: Spouse [name]) Available Help at  Discharge: Family;Available 24 hours/day Type of Home: House Home Access: Stairs to enter [employer] of Steps: 2 Entrance Stairs-Rails: None Home Layout: One level Bathroom Shower/Tub: Walk-in  shower;Door Bathroom Toilet: Handicapped height Home Adaptive Equipment: Environmental consultant - rolling;Crutches Prior Function Level of Independence: Independent Able to Take Stairs?: Yes Driving: Yes Vocation: Full time employment Comments: flies to[place]from [place] every week for work Communication Communication: No difficulties Dominant Hand: Left      Cognition  Overall Cognitive Status: Appears within functional limits for tasks assessed/performed    Extremity/Trunk Assessment Right Upper Extremity Assessment RUE ROM/Strength/Tone: Within functional levels RUE Sensation: WFL - Light Touch RUE Coordination: WFL - gross/fine motor Left Upper Extremity Assessment LUE ROM/Strength/Tone: Within functional levels LUE Sensation: WFL - Light Touch LUE Coordination: WFL - gross/fine motor   Mobility  Shoulder Instructions  Bed Mobility Bed Mobility: Not assessed Transfers Sit to Stand: 4: Min guard;With armrests;From chair/3-in-1;From toilet Stand to Sit: 4: Min guard;To chair/3-in-1;With armrests;To toilet Details for Transfer Assistance: min guard secondary to incr pain       Exercise     Balance     End of Session OT - End of Session Equipment Utilized During Treatment: Gait belt Activity Tolerance: Patient limited by pain Patient left: in chair;with call bell/phone within reach;with family/visitor present Nurse Communication: Mobility status  GO     [name] 11/25/2011, 2:52 PM

## 2011-11-25 NOTE — Progress Notes (Signed)
PT Cancellation Note  Treatment cancelled today due to patient's refusal to participate.  Pt worked with OT this afternoon and just got into his CPM.  He did not feel up to getting up a third time today.  PT gave exercise packet and will return in AM for more gait and there ex.    Rollene Rotunda Hudsyn Champine, PT, DPT [phone]   11/25/2011, 4:08 PM

## 2011-11-25 NOTE — Progress Notes (Addendum)
Foley removed. IV- NSL

## 2011-11-25 NOTE — Progress Notes (Signed)
Referral received for SNF. Chart reviewed and CSW has spoken with RNCM who indicates that patient is for DC to home with Home Health and possibly DME.  CSW to sign off. Please re-consult if CSW needs arise.  Lorri Frederick. [place] Pugh  [phone]

## 2011-11-26 LAB — CBC
HCT: 34 % — ABNORMAL LOW (ref 39.0–52.0)
MCH: 29.5 pg (ref 26.0–34.0)
MCV: 88.8 fL (ref 78.0–100.0)
Platelets: 158 10*3/uL (ref 150–400)
RBC: 3.83 MIL/uL — ABNORMAL LOW (ref 4.22–5.81)

## 2011-11-26 MED ORDER — ASPIRIN 325 MG PO TBEC: 325.0000 mg | DELAYED_RELEASE_TABLET | Freq: Two times a day (BID) | ORAL | Status: AC

## 2011-11-26 MED ORDER — METHOCARBAMOL 500 MG PO TABS: 500.0000 mg | ORAL_TABLET | Freq: Four times a day (QID) | ORAL | Status: AC | PRN

## 2011-11-26 MED ORDER — OXYCODONE-ACETAMINOPHEN 5-325 MG PO TABS: 1.0000 | ORAL_TABLET | ORAL | Status: AC | PRN

## 2011-11-26 NOTE — Progress Notes (Signed)
Subjective: 2 Days Post-Op Procedure(s) (LRB): TOTAL KNEE ARTHROPLASTY (Left)  Activity level:  OOB and AMB Diet tolerance:  regular Voiding:  Well ini BR Patient reports pain as mild.    Objective: Vital signs in last 24 hours: Temp:  [98 F (36.7 C)-98.6 F (37 C)] 98.3 F (36.8 C) (08/29 1300) Pulse Rate:  [79-91] 91  (08/29 1300) Resp:  [18] 18  (08/29 1300) BP: (103-112)/(48-60) 108/48 mmHg (08/29 1300) SpO2:  [96 %-98 %] 97 % (08/29 1300)  Labs:  Basename 11/26/11 0600 11/25/11 0600  HGB 11.3* 12.5*    Basename 11/26/11 0600 11/25/11 0600  WBC 7.9 7.7  RBC 3.83* 4.12*  HCT 34.0* 37.6*  PLT 158 181    Basename 11/25/11 0600  NA 133*  K 3.5  CL 97  CO2 30  BUN 15  CREATININE 1.19  GLUCOSE 146*  CALCIUM 8.9   No results found for this basename: LABPT:2,INR:2 in the last 72 hours  Physical Exam:  ABD soft Neurovascular intact Sensation intact distally Intact pulses distally Dorsiflexion/Plantar flexion intact Incision: no drainage Compartment soft  Assessment/Plan:  2 Days Post-Op Procedure(s) (LRB): TOTAL KNEE ARTHROPLASTY (Left)  Dressing changed Discharge home with home health Garry Heater 11/26/2011, 1:41 PM

## 2011-11-26 NOTE — Progress Notes (Signed)
CARE MANAGEMENT NOTE 11/26/2011  Patient:  Scott Hill, Scott Hill   Account Number:  [id number]  Date Initiated:  11/26/2011  Documentation initiated by:  Vance Peper  Subjective/Objective Assessment:   66 yr old male s/p left total knee arthroplasty     Action/Plan:   CM spoke with patient and wife regarding home health and DME needs. Choice offered. Patient preoperatively setup with [facility], no changes.Patient has rolling walker, will not need CPM. 3in1 ordered thru [employer].   Anticipated DC Date:  11/26/2011   Anticipated DC Plan:  HOME W HOME HEALTH SERVICES      DC Planning Services  CM consult      [facility] Choice  HOME HEALTH  DURABLE MEDICAL EQUIPMENT   Choice offered to / List presented to:  C-1 Patient   DME arranged  3-N-1      DME agency  [facility]     HH arranged  HH-2 PT      Status of service:  Completed, signed off Medicare Important Message given?   (If response is "NO", the following Medicare IM given date fields will be blank) Date Medicare IM given:   Date Additional Medicare IM given:    Discharge Disposition:  HOME W HOME HEALTH SERVICES  Per UR Regulation:    If discussed at Long Length of Stay Meetings, dates discussed:    Comments:

## 2011-11-26 NOTE — Progress Notes (Signed)
Physical Therapy Treatment Patient Details Name: Scott Hill MRN: [id number] DOB: 1946/02/15 Today's Date: 11/26/2011 Time: 7829-5621 PT Time Calculation (min): 25 min  PT Assessment / Plan / Recommendation Comments on Treatment Session  Pt progressing well with mobility. OK to DC home from PT standpoint, with HHPT. Pt ambulated 150' with RW independently.     Follow Up Recommendations  Home health PT    Barriers to Discharge        Equipment Recommendations       Recommendations for Other Services    Frequency 7X/week   Plan Discharge plan remains appropriate;Frequency remains appropriate    Precautions / Restrictions Precautions Precautions: Knee Restrictions Weight Bearing Restrictions: Yes LLE Weight Bearing: Weight bearing as tolerated   Pertinent Vitals/Pain *"It's ok" when asked if in pain, pt did not rate pain**    Mobility  Bed Mobility Bed Mobility: Sit to Supine Sit to Supine: 6: Modified independent (Device/Increase time) Transfers Transfers: Sit to Stand;Stand to Sit Sit to Stand: From chair/3-in-1;5: Supervision Stand to Sit: To bed;5: Supervision Details for Transfer Assistance: VCs for hand placement, and to back all the way up to bed prior to sitting Ambulation/Gait Ambulation/Gait Assistance: 6: Modified independent (Device/Increase time) Ambulation Distance (Feet): 150 Feet Assistive device: Rolling walker Gait Pattern: Step-through pattern General Gait Details: VCs for L heel strike    Exercises Total Joint Exercises Ankle Circles/Pumps: AROM;Both;10 reps;Supine Quad Sets: AROM;Left;10 reps;Supine Towel Squeeze: AROM;Both;10 reps;Supine Short Arc Quad: AAROM;Left;10 reps;Supine Heel Slides: AAROM;Left;10 reps;Supine Hip ABduction/ADduction: AAROM;Left;10 reps;Supine Straight Leg Raises: AAROM;Left;10 reps;Supine Long Arc Quad: AROM;Left;Seated Knee Flexion: AROM;Left;10 reps;Seated   PT Diagnosis:    PT Problem List:   PT Treatment  Interventions:     PT Goals Acute Rehab PT Goals PT Goal Formulation: With patient Time For Goal Achievement: 12/02/11 Potential to Achieve Goals: Good Pt will go Supine/Side to Sit: with modified independence Pt will go Sit to Supine/Side: with modified independence PT Goal: Sit to Supine/Side - Progress: Met Pt will go Sit to Stand: with supervision PT Goal: Sit to Stand - Progress: Met Pt will go Stand to Sit: with supervision PT Goal: Stand to Sit - Progress: Met Pt will Ambulate: >150 feet;with supervision;with rolling walker PT Goal: Ambulate - Progress: Met Pt will Go Up / Down Stairs: 1-2 stairs;with min assist;with least restrictive assistive device;with rolling walker Pt will Perform Home Exercise Program: with supervision, verbal cues required/provided PT Goal: Perform Home Exercise Program - Progress: Progressing toward goal  Visit Information  Last PT Received On: 11/26/11 Assistance Needed: +1    Subjective Data  Subjective: I tolerate pain pretty well.  Patient Stated Goal: return to working in [place]   Cognition  Overall Cognitive Status: Appears within functional limits for tasks assessed/performed Arousal/Alertness: Awake/alert Orientation Level: Appears intact for tasks assessed Behavior During Session: [facility] for tasks performed    Balance     End of Session PT - End of Session Activity Tolerance: Patient tolerated treatment well Patient left: in bed;with call bell/phone within reach;with family/visitor present Nurse Communication: Mobility status CPM Left Knee CPM Left Knee: Off   GP     Ralene Bathe Kistler 11/26/2011, 11:23 AM [phone]

## 2011-11-26 NOTE — Discharge Summary (Signed)
Patient ID: Scott Hill MRN: [id number] DOB/AGE: 65-Oct-1947 65 y.o.  Admit date: 11/24/2011 Discharge date: 11/26/2011  Admission Diagnoses:  Principal Problem:  *Left knee DJD   Discharge Diagnoses:  Same  Past Medical History  Diagnosis Date  . Hypertension   . GERD (gastroesophageal reflux disease)     occasionally  . Arthritis   . Hyperlipemia   . Kidney stones     Surgeries: Procedure(s): TOTAL KNEE ARTHROPLASTY on 11/24/2011   Consultants:    Discharged Condition: Improved  Hospital Course: JOHANTHAN KNEELAND is an 66 y.o. male who was admitted 11/24/2011 for operative treatment ofLeft knee DJD. Patient has severe unremitting pain that affects sleep, daily activities, and work/hobbies. After pre-op clearance the patient was taken to the operating room on 11/24/2011 and underwent  Procedure(s): TOTAL KNEE ARTHROPLASTY.    Patient was given perioperative antibiotics: Anti-infectives     Start     Dose/Rate Route Frequency Ordered Stop   11/24/11 1400   ceFAZolin (ANCEF) IVPB 2 g/50 mL premix        2 g 100 mL/hr over 30 Minutes Intravenous Every 6 hours 11/24/11 1142 11/24/11 2138   11/24/11 0858   cefUROXime (ZINACEF) injection  Status:  Discontinued          As needed 11/24/11 0858 11/24/11 0933   11/23/11 1459   ceFAZolin (ANCEF) 3 g in dextrose 5 % 50 mL IVPB        3 g 160 mL/hr over 30 Minutes Intravenous 60 min pre-op 11/23/11 1459 11/24/11 0743           Patient was given sequential compression devices, early ambulation, and chemoprophylaxis to prevent DVT. Patient will continue ASA 325 one by mouth twice a day for 2 weeks upon discharge. Patient benefited maximally from hospital stay and there were no complications.    Recent vital signs: Patient Vitals for the past 24 hrs:  BP Temp Pulse Resp SpO2  11/26/11 1300 108/48 mmHg 98.3 F (36.8 C) 91  18  97 %  11/26/11 0638 112/60 mmHg 98.6 F (37 C) 79  18  96 %  2011/12/19 1500 103/55 mmHg 98 F  (36.7 C) 84  18  98 %     Recent laboratory studies:  Basename 11/26/11 0600 2011/12/19 0600  WBC 7.9 7.7  HGB 11.3* 12.5*  HCT 34.0* 37.6*  PLT 158 181  NA -- 133*  K -- 3.5  CL -- 97  CO2 -- 30  BUN -- 15  CREATININE -- 1.19  GLUCOSE -- 146*  INR -- --  CALCIUM -- 8.9     Discharge Medications:   Medication List  As of 11/26/2011  2:08 PM   STOP taking these medications         aspirin 81 MG tablet         TAKE these medications         aspirin 325 MG EC tablet   Take 1 tablet (325 mg total) by mouth 2 (two) times daily.      losartan-hydrochlorothiazide 100-25 MG per tablet   Commonly known as: HYZAAR   Take 1 tablet by mouth daily.      methocarbamol 500 MG tablet   Commonly known as: ROBAXIN   Take 1 tablet (500 mg total) by mouth every 6 (six) hours as needed.      oxyCODONE-acetaminophen 5-325 MG per tablet   Commonly known as: PERCOCET/ROXICET   Take 1-2 tablets by mouth every 4 (four)  hours as needed.      simvastatin 40 MG tablet   Commonly known as: ZOCOR   Take 40 mg by mouth every evening.            Diagnostic Studies: Dg Chest 2 View  11/16/2011  *RADIOLOGY REPORT*  Clinical Data: Preop.  CHEST - 2 VIEW  Comparison: None.  Findings: Trachea is midline.  Heart size normal.  Lungs are mildly hyperinflated.  Biapical pleural thickening, right greater than left.  There is fullness in the right infrahilar region, not well correlated on the lateral view No pleural fluid.  IMPRESSION: Mild fullness in the right infrahilar region, not well correlated on the lateral view.  In this patient with a significant smoking history, CT chest with contrast would be helpful in further evaluation, as clinically indicated. These results will be called to the ordering clinician or representative by the Radiologist Assistant, and communication documented in the PACS Dashboard.   Original Report Authenticated By: Reyes Ivan, M.D. ( 11/16/2011 12:41:35 )    Ct  Chest W Contrast  11/20/2011  *RADIOLOGY REPORT*  Clinical Data: Evaluate abnormal chest radiograph  CT CHEST WITH CONTRAST  Technique:  Multidetector CT imaging of the chest was performed following the standard protocol during bolus administration of intravenous contrast.  Contrast:  75 ml of omni 300  Comparison: CT of the pelvis from 09/16/2002.  Findings:  No enlarged axillary or supraclavicular adenopathy.  No mediastinal or hilar adenopathy.  No pericardial or pleural effusions.  Lungs are clear.  No airspace consolidation.  Mild changes of emphysema.  No suspicious pulmonary nodule or mass.  Review of the visualized bony structures is unremarkable. Limited imaging through the upper abdomen shows diffuse fatty infiltration throughout the liver.  Within the dome of the liver there is an indeterminant hypoechoic mass lesion measuring 5.2 x 3.4 cm, image 45.  Smaller indeterminant hypodensity within the left hepatic lobe measures 1.3 cm.  Previous cholecystectomy. There is a cyst arising from the upper pole the right kidney, which is incompletely visualized.  IMPRESSION:  1.  No suspicious pulmonary parenchymal nodule or mass. 2.  Indeterminant hypoechoic lesion within the dome of the right hepatic lobe measures 5.2 x 3.4 cm. These were present on the examination from 11/16/2002 and most likely benign.   Original Report Authenticated By: Rosealee Albee, M.D.     Disposition: 01-Home or Self Care  Discharge Orders    Future Orders Please Complete By Expires   Diet - low sodium heart healthy      Call MD / Call 911      Comments:   If you experience chest pain or shortness of breath, CALL 911 and be transported to the hospital emergency room.  If you develope a fever above 101 F, pus (white drainage) or increased drainage or redness at the wound, or calf pain, call your surgeon's office.   Constipation Prevention      Comments:   Drink plenty of fluids.  Prune juice may be helpful.  You may use a  stool softener, such as Colace (over the counter) 100 mg twice a day.  Use MiraLax (over the counter) for constipation as needed.   Increase activity slowly as tolerated      Change dressing      Comments:   Change dressing on Saturday or Sunday, then change the dressing daily with sterile 4 x 4 inch gauze dressing and apply a six-inch Ace wrap.  You may clean  the incision with alcohol prior to redressing.     home health care-physical therapy will be provided by [place]  Follow-up Information    Follow up with Velna Ochs, MD in 2 weeks.   Contact information:   [address] [place] [address] [phone]           Signed: Prince Rome 11/26/2011, 2:08 PM

## 2012-05-23 ENCOUNTER — Other Ambulatory Visit: Payer: Self-pay | Admitting: Family Medicine

## 2012-06-06 ENCOUNTER — Ambulatory Visit
Admission: RE | Admit: 2012-06-06 | Discharge: 2012-06-06 | Disposition: A | Discharge status: Home / Self Care | Payer: Managed Care, Other (non HMO) | Source: Ambulatory Visit | Attending: Family Medicine | Admitting: Family Medicine

## 2012-06-06 DIAGNOSIS — Z0000 Encounter for general adult medical examination without abnormal findings: Secondary | ICD-10-CM

## 2013-05-12 ENCOUNTER — Encounter: Payer: Self-pay | Admitting: *Deleted

## 2013-05-12 DIAGNOSIS — E7800 Pure hypercholesterolemia, unspecified: Secondary | ICD-10-CM

## 2013-05-12 DIAGNOSIS — E11319 Type 2 diabetes mellitus with unspecified diabetic retinopathy without macular edema: Secondary | ICD-10-CM

## 2013-05-12 DIAGNOSIS — I10 Essential (primary) hypertension: Secondary | ICD-10-CM

## 2013-05-29 ENCOUNTER — Other Ambulatory Visit: Payer: Self-pay | Admitting: Family Medicine

## 2013-05-29 DIAGNOSIS — I719 Aortic aneurysm of unspecified site, without rupture: Secondary | ICD-10-CM

## 2013-05-30 ENCOUNTER — Encounter (INDEPENDENT_AMBULATORY_CARE_PROVIDER_SITE_OTHER): Payer: Self-pay | Admitting: Surgery

## 2013-06-05 ENCOUNTER — Ambulatory Visit
Admission: RE | Admit: 2013-06-05 | Discharge: 2013-06-05 | Disposition: A | Discharge status: Home / Self Care | Payer: Managed Care, Other (non HMO) | Source: Ambulatory Visit | Attending: Family Medicine | Admitting: Family Medicine

## 2013-06-05 ENCOUNTER — Ambulatory Visit (INDEPENDENT_AMBULATORY_CARE_PROVIDER_SITE_OTHER): Payer: Managed Care, Other (non HMO) | Admitting: Surgery

## 2013-06-05 DIAGNOSIS — I719 Aortic aneurysm of unspecified site, without rupture: Secondary | ICD-10-CM

## 2013-06-19 ENCOUNTER — Ambulatory Visit (INDEPENDENT_AMBULATORY_CARE_PROVIDER_SITE_OTHER): Payer: Managed Care, Other (non HMO) | Admitting: Surgery

## 2013-06-19 ENCOUNTER — Encounter (INDEPENDENT_AMBULATORY_CARE_PROVIDER_SITE_OTHER): Payer: Self-pay | Admitting: Surgery

## 2013-06-19 VITALS — BP 136/80 | HR 75 | Temp 97.5°F | Resp 16 | Ht 69.0 in | Wt 232.0 lb

## 2013-06-19 DIAGNOSIS — D1779 Benign lipomatous neoplasm of other sites: Secondary | ICD-10-CM

## 2013-06-19 DIAGNOSIS — D1720 Benign lipomatous neoplasm of skin and subcutaneous tissue of unspecified limb: Secondary | ICD-10-CM

## 2013-06-19 NOTE — Progress Notes (Signed)
Subjective:     Patient ID: Scott Hill, male   DOB: 03/02/1946, 68 y.o.   MRN: [id number]  HPI This is a pleasant gentleman has been seen in our office in the past for lipomas and cysts. It was many years ago. He is concerned about some new skin lesions that have occurred over the past several years. He has noted discomfort from any of except one on his right arm  Review of Systems     Objective:   Physical Exam On exam, he has what appears to be a sebaceous cyst in the left axilla. On his right upper arm, there is approximately a 3-4 cm lipoma at an old incision. He also has several 1 cm lipomas on his abdominal wall    Assessment:     Multiple lipomas     Plan:     I discussed the diagnosis with him. I again discussed the benign nature. He wants to hold off on any surgical excision. I feel this is reasonable. I will see him back in 6 months unless there is a problem

## 2014-06-04 DIAGNOSIS — R7309 Other abnormal glucose: Secondary | ICD-10-CM | POA: Diagnosis not present

## 2014-06-04 DIAGNOSIS — E669 Obesity, unspecified: Secondary | ICD-10-CM | POA: Diagnosis not present

## 2014-06-04 DIAGNOSIS — Z125 Encounter for screening for malignant neoplasm of prostate: Secondary | ICD-10-CM | POA: Diagnosis not present

## 2014-06-04 DIAGNOSIS — E781 Pure hyperglyceridemia: Secondary | ICD-10-CM | POA: Diagnosis not present

## 2014-06-04 DIAGNOSIS — Z0000 Encounter for general adult medical examination without abnormal findings: Secondary | ICD-10-CM | POA: Diagnosis not present

## 2014-06-04 DIAGNOSIS — I10 Essential (primary) hypertension: Secondary | ICD-10-CM | POA: Diagnosis not present

## 2014-06-04 DIAGNOSIS — Z87891 Personal history of nicotine dependence: Secondary | ICD-10-CM | POA: Diagnosis not present

## 2014-06-04 DIAGNOSIS — Z86010 Personal history of colonic polyps: Secondary | ICD-10-CM | POA: Diagnosis not present

## 2014-06-04 DIAGNOSIS — I719 Aortic aneurysm of unspecified site, without rupture: Secondary | ICD-10-CM | POA: Diagnosis not present

## 2014-08-27 DIAGNOSIS — I10 Essential (primary) hypertension: Secondary | ICD-10-CM | POA: Diagnosis not present

## 2014-09-13 DIAGNOSIS — I10 Essential (primary) hypertension: Secondary | ICD-10-CM | POA: Diagnosis not present

## 2014-12-05 DIAGNOSIS — E781 Pure hyperglyceridemia: Secondary | ICD-10-CM | POA: Diagnosis not present

## 2014-12-05 DIAGNOSIS — E669 Obesity, unspecified: Secondary | ICD-10-CM | POA: Diagnosis not present

## 2014-12-05 DIAGNOSIS — Z23 Encounter for immunization: Secondary | ICD-10-CM | POA: Diagnosis not present

## 2014-12-05 DIAGNOSIS — I10 Essential (primary) hypertension: Secondary | ICD-10-CM | POA: Diagnosis not present

## 2014-12-05 DIAGNOSIS — R5383 Other fatigue: Secondary | ICD-10-CM | POA: Diagnosis not present

## 2014-12-05 DIAGNOSIS — R972 Elevated prostate specific antigen [PSA]: Secondary | ICD-10-CM | POA: Diagnosis not present

## 2014-12-05 DIAGNOSIS — R7309 Other abnormal glucose: Secondary | ICD-10-CM | POA: Diagnosis not present

## 2014-12-28 DIAGNOSIS — H52222 Regular astigmatism, left eye: Secondary | ICD-10-CM | POA: Diagnosis not present

## 2014-12-28 DIAGNOSIS — H02831 Dermatochalasis of right upper eyelid: Secondary | ICD-10-CM | POA: Diagnosis not present

## 2014-12-28 DIAGNOSIS — H25813 Combined forms of age-related cataract, bilateral: Secondary | ICD-10-CM | POA: Diagnosis not present

## 2014-12-28 DIAGNOSIS — H527 Unspecified disorder of refraction: Secondary | ICD-10-CM | POA: Diagnosis not present

## 2014-12-28 DIAGNOSIS — H02834 Dermatochalasis of left upper eyelid: Secondary | ICD-10-CM | POA: Diagnosis not present

## 2015-01-02 DIAGNOSIS — H02834 Dermatochalasis of left upper eyelid: Secondary | ICD-10-CM | POA: Diagnosis not present

## 2015-01-02 DIAGNOSIS — H52222 Regular astigmatism, left eye: Secondary | ICD-10-CM | POA: Diagnosis not present

## 2015-01-02 DIAGNOSIS — H25813 Combined forms of age-related cataract, bilateral: Secondary | ICD-10-CM | POA: Diagnosis not present

## 2015-01-02 DIAGNOSIS — H02831 Dermatochalasis of right upper eyelid: Secondary | ICD-10-CM | POA: Diagnosis not present

## 2015-01-02 DIAGNOSIS — H527 Unspecified disorder of refraction: Secondary | ICD-10-CM | POA: Diagnosis not present

## 2015-01-22 DIAGNOSIS — I10 Essential (primary) hypertension: Secondary | ICD-10-CM | POA: Diagnosis not present

## 2015-01-22 DIAGNOSIS — H52222 Regular astigmatism, left eye: Secondary | ICD-10-CM | POA: Diagnosis not present

## 2015-01-22 DIAGNOSIS — E785 Hyperlipidemia, unspecified: Secondary | ICD-10-CM | POA: Diagnosis not present

## 2015-01-22 DIAGNOSIS — Z87891 Personal history of nicotine dependence: Secondary | ICD-10-CM | POA: Diagnosis not present

## 2015-01-22 DIAGNOSIS — H25811 Combined forms of age-related cataract, right eye: Secondary | ICD-10-CM | POA: Diagnosis not present

## 2015-01-22 DIAGNOSIS — Z79899 Other long term (current) drug therapy: Secondary | ICD-10-CM | POA: Diagnosis not present

## 2015-01-22 DIAGNOSIS — E669 Obesity, unspecified: Secondary | ICD-10-CM | POA: Diagnosis not present

## 2015-01-22 DIAGNOSIS — H25812 Combined forms of age-related cataract, left eye: Secondary | ICD-10-CM | POA: Diagnosis not present

## 2015-01-22 DIAGNOSIS — M1990 Unspecified osteoarthritis, unspecified site: Secondary | ICD-10-CM | POA: Diagnosis not present

## 2015-07-17 DIAGNOSIS — R7309 Other abnormal glucose: Secondary | ICD-10-CM | POA: Diagnosis not present

## 2015-07-17 DIAGNOSIS — E669 Obesity, unspecified: Secondary | ICD-10-CM | POA: Diagnosis not present

## 2015-07-17 DIAGNOSIS — R7303 Prediabetes: Secondary | ICD-10-CM | POA: Diagnosis not present

## 2015-07-17 DIAGNOSIS — Z86010 Personal history of colonic polyps: Secondary | ICD-10-CM | POA: Diagnosis not present

## 2015-07-17 DIAGNOSIS — I10 Essential (primary) hypertension: Secondary | ICD-10-CM | POA: Diagnosis not present

## 2015-07-17 DIAGNOSIS — Z125 Encounter for screening for malignant neoplasm of prostate: Secondary | ICD-10-CM | POA: Diagnosis not present

## 2015-07-17 DIAGNOSIS — Z6830 Body mass index (BMI) 30.0-30.9, adult: Secondary | ICD-10-CM | POA: Diagnosis not present

## 2015-07-17 DIAGNOSIS — E785 Hyperlipidemia, unspecified: Secondary | ICD-10-CM | POA: Diagnosis not present

## 2015-07-17 DIAGNOSIS — I719 Aortic aneurysm of unspecified site, without rupture: Secondary | ICD-10-CM | POA: Diagnosis not present

## 2015-07-17 DIAGNOSIS — R972 Elevated prostate specific antigen [PSA]: Secondary | ICD-10-CM | POA: Diagnosis not present

## 2015-07-17 DIAGNOSIS — Z87891 Personal history of nicotine dependence: Secondary | ICD-10-CM | POA: Diagnosis not present

## 2015-12-24 DIAGNOSIS — Z23 Encounter for immunization: Secondary | ICD-10-CM | POA: Diagnosis not present

## 2016-01-15 DIAGNOSIS — M25652 Stiffness of left hip, not elsewhere classified: Secondary | ICD-10-CM | POA: Diagnosis not present

## 2016-01-15 DIAGNOSIS — Z96652 Presence of left artificial knee joint: Secondary | ICD-10-CM | POA: Diagnosis not present

## 2016-01-15 DIAGNOSIS — Z09 Encounter for follow-up examination after completed treatment for conditions other than malignant neoplasm: Secondary | ICD-10-CM | POA: Diagnosis not present

## 2016-01-17 ENCOUNTER — Other Ambulatory Visit: Payer: Self-pay | Admitting: Family Medicine

## 2016-01-17 DIAGNOSIS — E785 Hyperlipidemia, unspecified: Secondary | ICD-10-CM | POA: Diagnosis not present

## 2016-01-17 DIAGNOSIS — Z6828 Body mass index (BMI) 28.0-28.9, adult: Secondary | ICD-10-CM | POA: Diagnosis not present

## 2016-01-17 DIAGNOSIS — E663 Overweight: Secondary | ICD-10-CM | POA: Diagnosis not present

## 2016-01-17 DIAGNOSIS — I719 Aortic aneurysm of unspecified site, without rupture: Secondary | ICD-10-CM

## 2016-01-17 DIAGNOSIS — Z1159 Encounter for screening for other viral diseases: Secondary | ICD-10-CM | POA: Diagnosis not present

## 2016-01-29 ENCOUNTER — Ambulatory Visit
Admission: RE | Admit: 2016-01-29 | Discharge: 2016-01-29 | Disposition: A | Discharge status: Home / Self Care | Payer: Medicare Other | Source: Ambulatory Visit | Attending: Family Medicine | Admitting: Family Medicine

## 2016-01-29 DIAGNOSIS — I719 Aortic aneurysm of unspecified site, without rupture: Secondary | ICD-10-CM

## 2016-01-29 DIAGNOSIS — I714 Abdominal aortic aneurysm, without rupture: Secondary | ICD-10-CM | POA: Diagnosis not present

## 2016-07-22 DIAGNOSIS — Z86010 Personal history of colonic polyps: Secondary | ICD-10-CM | POA: Diagnosis not present

## 2016-07-22 DIAGNOSIS — R7303 Prediabetes: Secondary | ICD-10-CM | POA: Diagnosis not present

## 2016-07-22 DIAGNOSIS — E669 Obesity, unspecified: Secondary | ICD-10-CM | POA: Diagnosis not present

## 2016-07-22 DIAGNOSIS — I719 Aortic aneurysm of unspecified site, without rupture: Secondary | ICD-10-CM | POA: Diagnosis not present

## 2016-07-22 DIAGNOSIS — Z6830 Body mass index (BMI) 30.0-30.9, adult: Secondary | ICD-10-CM | POA: Diagnosis not present

## 2016-07-22 DIAGNOSIS — R972 Elevated prostate specific antigen [PSA]: Secondary | ICD-10-CM | POA: Diagnosis not present

## 2016-07-22 DIAGNOSIS — Z87891 Personal history of nicotine dependence: Secondary | ICD-10-CM | POA: Diagnosis not present

## 2016-07-22 DIAGNOSIS — Z79899 Other long term (current) drug therapy: Secondary | ICD-10-CM | POA: Diagnosis not present

## 2016-07-22 DIAGNOSIS — E785 Hyperlipidemia, unspecified: Secondary | ICD-10-CM | POA: Diagnosis not present

## 2016-07-27 DIAGNOSIS — R7303 Prediabetes: Secondary | ICD-10-CM | POA: Diagnosis not present

## 2016-07-27 DIAGNOSIS — R972 Elevated prostate specific antigen [PSA]: Secondary | ICD-10-CM | POA: Diagnosis not present

## 2016-07-27 DIAGNOSIS — M25531 Pain in right wrist: Secondary | ICD-10-CM | POA: Diagnosis not present

## 2016-07-27 DIAGNOSIS — Z711 Person with feared health complaint in whom no diagnosis is made: Secondary | ICD-10-CM | POA: Diagnosis not present

## 2016-08-04 DIAGNOSIS — D485 Neoplasm of uncertain behavior of skin: Secondary | ICD-10-CM | POA: Diagnosis not present

## 2016-08-04 DIAGNOSIS — L219 Seborrheic dermatitis, unspecified: Secondary | ICD-10-CM | POA: Diagnosis not present

## 2016-08-04 DIAGNOSIS — L853 Xerosis cutis: Secondary | ICD-10-CM | POA: Diagnosis not present

## 2016-08-26 DIAGNOSIS — M25531 Pain in right wrist: Secondary | ICD-10-CM | POA: Diagnosis not present

## 2016-12-14 DIAGNOSIS — Z23 Encounter for immunization: Secondary | ICD-10-CM | POA: Diagnosis not present

## 2017-01-25 DIAGNOSIS — R972 Elevated prostate specific antigen [PSA]: Secondary | ICD-10-CM | POA: Diagnosis not present

## 2017-01-25 DIAGNOSIS — E669 Obesity, unspecified: Secondary | ICD-10-CM | POA: Diagnosis not present

## 2017-01-25 DIAGNOSIS — E785 Hyperlipidemia, unspecified: Secondary | ICD-10-CM | POA: Diagnosis not present

## 2017-03-09 DIAGNOSIS — L821 Other seborrheic keratosis: Secondary | ICD-10-CM | POA: Diagnosis not present

## 2017-03-09 DIAGNOSIS — D485 Neoplasm of uncertain behavior of skin: Secondary | ICD-10-CM | POA: Diagnosis not present

## 2017-06-28 DIAGNOSIS — N5201 Erectile dysfunction due to arterial insufficiency: Secondary | ICD-10-CM | POA: Diagnosis not present

## 2017-06-28 DIAGNOSIS — N401 Enlarged prostate with lower urinary tract symptoms: Secondary | ICD-10-CM | POA: Diagnosis not present

## 2017-06-28 DIAGNOSIS — N486 Induration penis plastica: Secondary | ICD-10-CM | POA: Diagnosis not present

## 2017-06-28 DIAGNOSIS — R350 Frequency of micturition: Secondary | ICD-10-CM | POA: Diagnosis not present

## 2017-07-28 DIAGNOSIS — Z6832 Body mass index (BMI) 32.0-32.9, adult: Secondary | ICD-10-CM | POA: Diagnosis not present

## 2017-07-28 DIAGNOSIS — E669 Obesity, unspecified: Secondary | ICD-10-CM | POA: Diagnosis not present

## 2017-07-28 DIAGNOSIS — E785 Hyperlipidemia, unspecified: Secondary | ICD-10-CM | POA: Diagnosis not present

## 2017-07-28 DIAGNOSIS — I719 Aortic aneurysm of unspecified site, without rupture: Secondary | ICD-10-CM | POA: Diagnosis not present

## 2017-07-28 DIAGNOSIS — R972 Elevated prostate specific antigen [PSA]: Secondary | ICD-10-CM | POA: Diagnosis not present

## 2017-08-10 DIAGNOSIS — L720 Epidermal cyst: Secondary | ICD-10-CM | POA: Diagnosis not present

## 2017-08-24 DIAGNOSIS — R972 Elevated prostate specific antigen [PSA]: Secondary | ICD-10-CM | POA: Diagnosis not present

## 2017-08-31 DIAGNOSIS — L720 Epidermal cyst: Secondary | ICD-10-CM | POA: Diagnosis not present

## 2017-09-20 DIAGNOSIS — C61 Malignant neoplasm of prostate: Secondary | ICD-10-CM | POA: Diagnosis not present

## 2017-09-20 DIAGNOSIS — D075 Carcinoma in situ of prostate: Secondary | ICD-10-CM | POA: Diagnosis not present

## 2017-09-20 DIAGNOSIS — R972 Elevated prostate specific antigen [PSA]: Secondary | ICD-10-CM | POA: Diagnosis not present

## 2017-09-27 DIAGNOSIS — C61 Malignant neoplasm of prostate: Secondary | ICD-10-CM | POA: Diagnosis not present

## 2017-10-19 DIAGNOSIS — C61 Malignant neoplasm of prostate: Secondary | ICD-10-CM | POA: Diagnosis not present

## 2017-10-19 DIAGNOSIS — N486 Induration penis plastica: Secondary | ICD-10-CM | POA: Diagnosis not present

## 2017-11-16 DIAGNOSIS — N486 Induration penis plastica: Secondary | ICD-10-CM | POA: Diagnosis not present

## 2017-12-08 DIAGNOSIS — M9903 Segmental and somatic dysfunction of lumbar region: Secondary | ICD-10-CM | POA: Diagnosis not present

## 2017-12-08 DIAGNOSIS — M5126 Other intervertebral disc displacement, lumbar region: Secondary | ICD-10-CM | POA: Diagnosis not present

## 2017-12-09 DIAGNOSIS — M9903 Segmental and somatic dysfunction of lumbar region: Secondary | ICD-10-CM | POA: Diagnosis not present

## 2017-12-09 DIAGNOSIS — M5126 Other intervertebral disc displacement, lumbar region: Secondary | ICD-10-CM | POA: Diagnosis not present

## 2017-12-10 DIAGNOSIS — M5126 Other intervertebral disc displacement, lumbar region: Secondary | ICD-10-CM | POA: Diagnosis not present

## 2017-12-10 DIAGNOSIS — M9903 Segmental and somatic dysfunction of lumbar region: Secondary | ICD-10-CM | POA: Diagnosis not present

## 2017-12-14 DIAGNOSIS — M9905 Segmental and somatic dysfunction of pelvic region: Secondary | ICD-10-CM | POA: Diagnosis not present

## 2017-12-14 DIAGNOSIS — M9904 Segmental and somatic dysfunction of sacral region: Secondary | ICD-10-CM | POA: Diagnosis not present

## 2017-12-14 DIAGNOSIS — M9902 Segmental and somatic dysfunction of thoracic region: Secondary | ICD-10-CM | POA: Diagnosis not present

## 2017-12-14 DIAGNOSIS — M9903 Segmental and somatic dysfunction of lumbar region: Secondary | ICD-10-CM | POA: Diagnosis not present

## 2017-12-15 DIAGNOSIS — Z23 Encounter for immunization: Secondary | ICD-10-CM | POA: Diagnosis not present

## 2017-12-15 DIAGNOSIS — M545 Low back pain: Secondary | ICD-10-CM | POA: Diagnosis not present

## 2017-12-17 DIAGNOSIS — M9903 Segmental and somatic dysfunction of lumbar region: Secondary | ICD-10-CM | POA: Diagnosis not present

## 2017-12-17 DIAGNOSIS — M9904 Segmental and somatic dysfunction of sacral region: Secondary | ICD-10-CM | POA: Diagnosis not present

## 2017-12-17 DIAGNOSIS — M9905 Segmental and somatic dysfunction of pelvic region: Secondary | ICD-10-CM | POA: Diagnosis not present

## 2017-12-17 DIAGNOSIS — M9902 Segmental and somatic dysfunction of thoracic region: Secondary | ICD-10-CM | POA: Diagnosis not present

## 2018-01-25 DIAGNOSIS — M9905 Segmental and somatic dysfunction of pelvic region: Secondary | ICD-10-CM | POA: Diagnosis not present

## 2018-01-25 DIAGNOSIS — M9902 Segmental and somatic dysfunction of thoracic region: Secondary | ICD-10-CM | POA: Diagnosis not present

## 2018-01-25 DIAGNOSIS — M9904 Segmental and somatic dysfunction of sacral region: Secondary | ICD-10-CM | POA: Diagnosis not present

## 2018-01-25 DIAGNOSIS — M9903 Segmental and somatic dysfunction of lumbar region: Secondary | ICD-10-CM | POA: Diagnosis not present

## 2018-01-31 DIAGNOSIS — M9903 Segmental and somatic dysfunction of lumbar region: Secondary | ICD-10-CM | POA: Diagnosis not present

## 2018-01-31 DIAGNOSIS — M9902 Segmental and somatic dysfunction of thoracic region: Secondary | ICD-10-CM | POA: Diagnosis not present

## 2018-01-31 DIAGNOSIS — M9904 Segmental and somatic dysfunction of sacral region: Secondary | ICD-10-CM | POA: Diagnosis not present

## 2018-01-31 DIAGNOSIS — M9905 Segmental and somatic dysfunction of pelvic region: Secondary | ICD-10-CM | POA: Diagnosis not present

## 2018-02-01 DIAGNOSIS — E785 Hyperlipidemia, unspecified: Secondary | ICD-10-CM | POA: Diagnosis not present

## 2018-02-01 DIAGNOSIS — N486 Induration penis plastica: Secondary | ICD-10-CM | POA: Diagnosis not present

## 2018-02-01 DIAGNOSIS — Z1389 Encounter for screening for other disorder: Secondary | ICD-10-CM | POA: Diagnosis not present

## 2018-02-01 DIAGNOSIS — Z6831 Body mass index (BMI) 31.0-31.9, adult: Secondary | ICD-10-CM | POA: Diagnosis not present

## 2018-02-01 DIAGNOSIS — Z79899 Other long term (current) drug therapy: Secondary | ICD-10-CM | POA: Diagnosis not present

## 2018-02-01 DIAGNOSIS — Z0000 Encounter for general adult medical examination without abnormal findings: Secondary | ICD-10-CM | POA: Diagnosis not present

## 2018-02-01 DIAGNOSIS — E669 Obesity, unspecified: Secondary | ICD-10-CM | POA: Diagnosis not present

## 2018-02-01 DIAGNOSIS — I719 Aortic aneurysm of unspecified site, without rupture: Secondary | ICD-10-CM | POA: Diagnosis not present

## 2018-02-01 DIAGNOSIS — C61 Malignant neoplasm of prostate: Secondary | ICD-10-CM | POA: Diagnosis not present

## 2018-02-01 DIAGNOSIS — Z86010 Personal history of colonic polyps: Secondary | ICD-10-CM | POA: Diagnosis not present

## 2018-02-11 ENCOUNTER — Other Ambulatory Visit: Payer: Self-pay | Admitting: Urology

## 2018-02-11 DIAGNOSIS — T1590XA Foreign body on external eye, part unspecified, unspecified eye, initial encounter: Secondary | ICD-10-CM

## 2018-02-11 DIAGNOSIS — C61 Malignant neoplasm of prostate: Secondary | ICD-10-CM

## 2018-02-28 ENCOUNTER — Other Ambulatory Visit: Payer: Medicare Other

## 2018-03-07 ENCOUNTER — Other Ambulatory Visit: Payer: Medicare Other

## 2018-03-09 ENCOUNTER — Ambulatory Visit
Admission: RE | Admit: 2018-03-09 | Discharge: 2018-03-09 | Disposition: A | Discharge status: Home / Self Care | Payer: Medicare Other | Source: Ambulatory Visit | Attending: Urology | Admitting: Urology

## 2018-03-09 DIAGNOSIS — Z77018 Contact with and (suspected) exposure to other hazardous metals: Secondary | ICD-10-CM | POA: Diagnosis not present

## 2018-03-09 DIAGNOSIS — C61 Malignant neoplasm of prostate: Secondary | ICD-10-CM | POA: Diagnosis not present

## 2018-03-09 DIAGNOSIS — T1590XA Foreign body on external eye, part unspecified, unspecified eye, initial encounter: Secondary | ICD-10-CM

## 2018-03-09 DIAGNOSIS — R972 Elevated prostate specific antigen [PSA]: Secondary | ICD-10-CM | POA: Diagnosis not present

## 2018-03-09 MED ORDER — GADOBENATE DIMEGLUMINE 529 MG/ML IV SOLN
18.0000 mL | Freq: Once | INTRAVENOUS | Status: AC | PRN
  Administered 2018-03-09: 18 mL via INTRAVENOUS

## 2018-04-15 DIAGNOSIS — C61 Malignant neoplasm of prostate: Secondary | ICD-10-CM | POA: Diagnosis not present

## 2018-04-25 DIAGNOSIS — N401 Enlarged prostate with lower urinary tract symptoms: Secondary | ICD-10-CM | POA: Diagnosis not present

## 2018-04-25 DIAGNOSIS — R350 Frequency of micturition: Secondary | ICD-10-CM | POA: Diagnosis not present

## 2018-04-25 DIAGNOSIS — N5201 Erectile dysfunction due to arterial insufficiency: Secondary | ICD-10-CM | POA: Diagnosis not present

## 2018-04-25 DIAGNOSIS — C61 Malignant neoplasm of prostate: Secondary | ICD-10-CM | POA: Diagnosis not present

## 2018-04-28 ENCOUNTER — Encounter: Payer: Self-pay | Admitting: *Deleted

## 2018-05-11 DIAGNOSIS — H26492 Other secondary cataract, left eye: Secondary | ICD-10-CM | POA: Diagnosis not present

## 2018-05-11 DIAGNOSIS — H527 Unspecified disorder of refraction: Secondary | ICD-10-CM | POA: Diagnosis not present

## 2018-05-11 DIAGNOSIS — H25811 Combined forms of age-related cataract, right eye: Secondary | ICD-10-CM | POA: Diagnosis not present

## 2018-05-11 DIAGNOSIS — H52201 Unspecified astigmatism, right eye: Secondary | ICD-10-CM | POA: Diagnosis not present

## 2018-05-11 DIAGNOSIS — H02834 Dermatochalasis of left upper eyelid: Secondary | ICD-10-CM | POA: Diagnosis not present

## 2018-05-11 DIAGNOSIS — H02831 Dermatochalasis of right upper eyelid: Secondary | ICD-10-CM | POA: Diagnosis not present

## 2018-05-11 DIAGNOSIS — Z961 Presence of intraocular lens: Secondary | ICD-10-CM | POA: Diagnosis not present

## 2018-05-16 ENCOUNTER — Encounter: Payer: Self-pay | Admitting: Radiation Oncology

## 2018-05-16 NOTE — Progress Notes (Signed)
GU Location of Tumor / Histology: prostatic adenocarcinoma  If Prostate Cancer, Gleason Score is (3 + 4) and PSA is (8.15) on 02/03/2018 up from 6.8 in 07/2017. Prostate volume: 72 g. Jasier Calabretta was diagnosed in June 2019 and has been under active surveillance since. Reports he had been told his PSA was elevated before June of 19 but can't recall when.    Biopsies of prostate (if applicable) revealed:    Past/Anticipated interventions by urology, if any: prostate biopsy, active surveillance, MRI fusion biopsy, referral for consideration of radiation therapy  Past/Anticipated interventions by medical oncology, if any: no  Weight changes, if any: no  Bowel/Bladder complaints, if any: IPSS  15. SHIM 22. Denies dysuria or hematuria. Reports occasional urinary leakage related to urinary urgency.  Nausea/Vomiting, if any: no  Pain issues, if any:  Reports sciatica x six months. Reports the sciatica is the worst in the morning when he first gets out of bed. Reports stretching helps relieve the pain.    SAFETY ISSUES:  Prior radiation? no  Pacemaker/ICD? no  Possible current pregnancy? no, male patient  Is the patient on methotrexate? no  Current Complaints / other details:  73 year old male. NKDA. Married. Reports a strong paternal history of cancer.

## 2018-05-17 ENCOUNTER — Other Ambulatory Visit: Payer: Self-pay

## 2018-05-17 ENCOUNTER — Encounter: Payer: Self-pay | Admitting: Radiation Oncology

## 2018-05-17 ENCOUNTER — Ambulatory Visit
Admission: RE | Admit: 2018-05-17 | Discharge: 2018-05-17 | Disposition: A | Discharge status: Home / Self Care | Payer: Medicare Other | Source: Ambulatory Visit | Attending: Radiation Oncology | Admitting: Radiation Oncology

## 2018-05-17 ENCOUNTER — Encounter: Payer: Self-pay | Admitting: Medical Oncology

## 2018-05-17 ENCOUNTER — Telehealth: Payer: Self-pay | Admitting: Medical Oncology

## 2018-05-17 VITALS — BP 161/93 | HR 62 | Temp 97.6°F | Resp 18 | Ht 70.0 in | Wt 218.1 lb

## 2018-05-17 DIAGNOSIS — Z87891 Personal history of nicotine dependence: Secondary | ICD-10-CM | POA: Diagnosis not present

## 2018-05-17 DIAGNOSIS — I10 Essential (primary) hypertension: Secondary | ICD-10-CM | POA: Diagnosis not present

## 2018-05-17 DIAGNOSIS — Z79899 Other long term (current) drug therapy: Secondary | ICD-10-CM | POA: Insufficient documentation

## 2018-05-17 DIAGNOSIS — C61 Malignant neoplasm of prostate: Secondary | ICD-10-CM

## 2018-05-17 DIAGNOSIS — R972 Elevated prostate specific antigen [PSA]: Secondary | ICD-10-CM | POA: Diagnosis not present

## 2018-05-17 HISTORY — DX: Malignant neoplasm of prostate: C61

## 2018-05-17 NOTE — Progress Notes (Signed)
Introduced myself to Scott Hill. [place] and his wife as the prostate nurse navigator and my role. He states Dr. Louis Meckel does not think he is a surgical candidate so he is here to discuss his radiation options. He had questions about seed implant but due to prostate volume, I informed him he may not be a candidate without medication to downsize. He is leaning towards external beam radiation. He will discuss treatment options in detail with Dr. Forrest Moron. I gave them my business card and asked them to call me with any question or concerns. He voiced understanding.

## 2018-05-17 NOTE — Progress Notes (Signed)
See progress note under physician encounter. 

## 2018-05-17 NOTE — Telephone Encounter (Signed)
Scott Hill called asking if having cataract surgery will interfere with his prostate radiation. He is scheduled for surgery 3/12. I informed him it should not interfere. He voiced understanding.

## 2018-05-17 NOTE — Progress Notes (Signed)
Radiation Oncology         [phone] ________________________________  Initial outpatient Consultation  Name: Scott Hill MRN: [id number]  Date: 05/17/2018  DOB: 11/24/1945  WU:[id number], Lattie Haw, MD  Ardis Hughs, MD   REFERRING PHYSICIAN: Ardis Hughs, MD  DIAGNOSIS: 73 y.o. gentleman with Stage T1c adenocarcinoma of the prostate with Gleason score of 3+4, and PSA of 8.15.    ICD-10-CM   1. Primary prostate malignancy ([place]) C61     HISTORY OF PRESENT ILLNESS: Scott Hill is a 74 y.o. male with a diagnosis of prostate cancer. He is an established patient of Dr. Louis Meckel.  He was noted to have an elevated PSA of 6.8 and underwent prostate biopsy on 09/13/2017. This showed Gleason 3+4 in 2 cores and Gleason 3+3 in 2 cores. He opted for active surveillance at the time.  A repeat PSA on 02/03/2018 was further elevated at 8.15. He underwent prostate MRI on 03/09/2018, which revealed a 6 mm PI-RADS 3 lesion in the left posterolateral mid gland. The patient proceeded to MRI fusion biopsy of the prostate on 04/15/2018.  The prostate volume measured 72 cc.  Out of 16 core biopsies, 6 were positive, with 3 out of 4 ROI lesion samples positive.  The maximum Gleason score was 3+4, and this was seen in 3/4 cores from the region of interest (ROI). Gleason 3+3 was also seen in left base lateral, left mid lateral, and left apex lateral.  The patient reviewed the biopsy results with his urologist and he has kindly been referred today for discussion of potential radiation treatment options.  PREVIOUS RADIATION THERAPY: No  PAST MEDICAL HISTORY:  Past Medical History:  Diagnosis Date  . Arthritis   . GERD (gastroesophageal reflux disease)    occasionally  . Hyperlipemia   . Hypertension   . Kidney stones   . Prostate cancer ([place])       PAST SURGICAL HISTORY: Past Surgical History:  Procedure Laterality Date  . CHOLECYSTECTOMY    . fatty tumors     begein,numerous  . KNEE SURGERY      cartledge removed  . TOTAL KNEE ARTHROPLASTY  11/24/2011   left knee  . TOTAL KNEE ARTHROPLASTY  11/24/2011   Procedure: TOTAL KNEE ARTHROPLASTY;  Surgeon: Hessie Dibble, MD;  Location: [facility];  Service: Orthopedics;  Laterality: Left;  with revision tibia    FAMILY HISTORY:  Family History  Problem Relation Age of Onset  . Diabetes Mother   . Cancer - Lung Father   . Diabetes Sister   . Anemia Neg Hx   . Arrhythmia Neg Hx   . Asthma Neg Hx   . Clotting disorder Neg Hx   . Fainting Neg Hx   . Heart attack Neg Hx   . Heart disease Neg Hx   . Heart failure Neg Hx   . Hyperlipidemia Neg Hx   . Hypertension Neg Hx     SOCIAL HISTORY:  Social History   Socioeconomic History  . Marital status: Married    Spouse name: Not on file  . Number of children: 0  . Years of education: Not on file  . Highest education level: Not on file  Occupational History    Comment: retired  Scientific laboratory technician  . Financial resource strain: Not on file  . Food insecurity:    Worry: Not on file    Inability: Not on file  . Transportation needs:    Medical: Not on file  Non-medical: Not on file  Tobacco Use  . Smoking status: Former Smoker    Packs/day: 1.00    Years: 30.00    Pack years: 30.00    Types: Cigarettes    Last attempt to quit: 03/30/1996    Years since quitting: 22.1  . Smokeless tobacco: Never Used  Substance and Sexual Activity  . Alcohol use: Yes    Alcohol/week: 10.0 standard drinks    Types: 10 Glasses of wine per week    Comment: occassional  . Drug use: No  . Sexual activity: Not Currently  Lifestyle  . Physical activity:    Days per week: Not on file    Minutes per session: Not on file  . Stress: Not on file  Relationships  . Social connections:    Talks on phone: Not on file    Gets together: Not on file    Attends religious service: Not on file    Active member of club or organization: Not on file    Attends meetings of clubs or organizations: Not on file      Relationship status: Not on file  . Intimate partner violence:    Fear of current or ex partner: Not on file    Emotionally abused: Not on file    Physically abused: Not on file    Forced sexual activity: Not on file  Other Topics Concern  . Not on file  Social History Narrative   Married 45 years (2020).    ALLERGIES: Patient has no known allergies.  MEDICATIONS:  Current Outpatient Medications  Medication Sig Dispense Refill  . calcium-vitamin D (OSCAL WITH D) 500-200 MG-UNIT tablet Take 1 tablet by mouth.    . simvastatin (ZOCOR) 40 MG tablet Take 40 mg by mouth every evening.     No current facility-administered medications for this encounter.     REVIEW OF SYSTEMS:  On review of systems, the patient reports that he is doing well overall. He denies any chest pain, shortness of breath, cough, fevers, chills, night sweats, unintended weight changes. He denies any bowel disturbances, and denies abdominal pain, nausea or vomiting. He denies any new musculoskeletal or joint aches or pains. He has a history of sciatica the last six months. His IPSS was 15, indicating moderate urinary symptoms. He reports nocturia up to 15 times per night and occasional urinary leakage related to urinary urgency. His SHIM was 22, indicating he does not have erectile dysfunction. A complete review of systems is obtained and is otherwise negative.    PHYSICAL EXAM:  Wt Readings from Last 3 Encounters:  05/17/18 218 lb 2 oz (98.9 kg)  06/19/13 232 lb (105.2 kg)  11/24/11 235 lb (106.6 kg)   Temp Readings from Last 3 Encounters:  05/17/18 97.6 F (36.4 C) (Oral)  06/19/13 97.5 F (36.4 C) (Oral)  11/26/11 98.3 F (36.8 C)   BP Readings from Last 3 Encounters:  05/17/18 (!) 161/93  06/19/13 136/80  11/26/11 (!) 108/48   Pulse Readings from Last 3 Encounters:  05/17/18 62  06/19/13 75  11/26/11 91   Pain Assessment Pain Score: 0-No pain/10  In general this is a well appearing Caucasian  gentleman in no acute distress. He is alert and oriented x4 and appropriate throughout the examination. HEENT reveals that the patient is normocephalic, atraumatic. EOMs are intact. PERRLA. Skin is intact without any evidence of gross lesions. Cardiovascular exam reveals a regular rate and rhythm, no clicks rubs or murmurs are auscultated. Chest  is clear to auscultation bilaterally. Lymphatic assessment is performed and does not reveal any adenopathy in the cervical, supraclavicular, axillary, or inguinal chains. Abdomen has active bowel sounds in all quadrants and is intact. The abdomen is soft, non tender, non distended. Lower extremities are negative for pretibial pitting edema, deep calf tenderness, cyanosis or clubbing.   KPS = 100  100 - Normal; no complaints; no evidence of disease. 90   - Able to carry on normal activity; minor signs or symptoms of disease. 80   - Normal activity with effort; some signs or symptoms of disease. 93   - Cares for self; unable to carry on normal activity or to do active work. 60   - Requires occasional assistance, but is able to care for most of his personal needs. 50   - Requires considerable assistance and frequent medical care. 85   - Disabled; requires special care and assistance. 32   - Severely disabled; hospital admission is indicated although death not imminent. 49   - Very sick; hospital admission necessary; active supportive treatment necessary. 10   - Moribund; fatal processes progressing rapidly. 0     - Dead  Karnofsky DA, Abelmann Liebenthal, Craver LS and Burchenal [facility] [phone]) The use of the nitrogen mustards in the palliative treatment of carcinoma: with particular reference to bronchogenic carcinoma Cancer 1 634-56  LABORATORY DATA:  Lab Results  Component Value Date   WBC 7.9 11/26/2011   HGB 11.3 (L) 11/26/2011   HCT 34.0 (L) 11/26/2011   MCV 88.8 11/26/2011   PLT 158 11/26/2011   Lab Results  Component Value Date   NA 133 (L) 11/25/2011    K 3.5 11/25/2011   CL 97 11/25/2011   CO2 30 11/25/2011   No results found for: ALT, AST, GGT, ALKPHOS, BILITOT   RADIOGRAPHY: No results found.    IMPRESSION/PLAN: 1. 73 y.o. gentleman with Stage T1c adenocarcinoma of the prostate with Gleason Score of 3+4, and PSA of 8.15. We discussed the patient's workup and outlined the nature of prostate cancer in this setting. The patient's T stage, Gleason's score, and PSA put him into the favorable intermediate risk group. Accordingly, he is eligible for a variety of potential treatment options including brachytherapy or 5.5 weeks of external radiation.  We discussed the available radiation techniques, and focused on the details and logistics and delivery. The patient is not an ideal candidate for brachytherapy boost with a prostate volume of 72 g.  Our recommendation is to proceed with a 5-1/2-week course of daily radiotherapy. We discussed and outlined the risks, benefits, short and long-term effects associated with radiation in this setting. We discussed the role of SpaceOAR in reducing the rectal toxicity associated with radiotherapy.   At the end of the conversation the patient is interested in moving forward with 5.5 weeks of external beam therapy. We will contact [facility] to make arrangements for fiducial marker with SpaceOAR placement prior to simulation to reduce rectal toxicity from radiotherapy. He will be scheduled for simulation in the near future. We will share our discussion with Dr. Louis Meckel and move forward with treatment planning in anticipation of beginning IMRT in the near future.  We spent 60 minutes face to face with the patient and more than 50% of that time was spent in counseling and/or coordination of care.    Nicholos Johns, PA-C    Tyler Pita, MD  [facility] Direct Dial: [phone]  Fax: [phone] [email or web address]  Skype  [email or web address]   This document serves as a record of services  personally performed by Tyler Pita, MD and Freeman Caldron, PA-C. It was created on their behalf by Wilburn Mylar, a trained medical scribe. The creation of this record is based on the scribe's personal observations and the provider's statements to them. This document has been checked and approved by the attending provider.

## 2018-05-18 DIAGNOSIS — C61 Malignant neoplasm of prostate: Secondary | ICD-10-CM | POA: Insufficient documentation

## 2018-05-20 DIAGNOSIS — H25811 Combined forms of age-related cataract, right eye: Secondary | ICD-10-CM | POA: Diagnosis not present

## 2018-05-20 DIAGNOSIS — H52201 Unspecified astigmatism, right eye: Secondary | ICD-10-CM | POA: Diagnosis not present

## 2018-05-24 ENCOUNTER — Other Ambulatory Visit: Payer: Self-pay | Admitting: Urology

## 2018-05-24 DIAGNOSIS — C61 Malignant neoplasm of prostate: Secondary | ICD-10-CM

## 2018-05-27 ENCOUNTER — Telehealth: Payer: Self-pay | Admitting: *Deleted

## 2018-05-27 NOTE — Telephone Encounter (Signed)
CALLED PATIENT TO INFORM OF FID. MARKERS AND SPACE OAR- 05-30-18 @ [facility], AND HIS SIM ON 06-07-18 - ARRIVAL TIME - 9:45 AM @[facility] , SPOKE WITH PATIENT AND HE IS AWARE OF THESE APPTS.

## 2018-05-30 DIAGNOSIS — C61 Malignant neoplasm of prostate: Secondary | ICD-10-CM | POA: Diagnosis not present

## 2018-05-31 ENCOUNTER — Encounter: Payer: Self-pay | Admitting: *Deleted

## 2018-06-07 ENCOUNTER — Ambulatory Visit
Admission: RE | Admit: 2018-06-07 | Discharge: 2018-06-07 | Disposition: A | Discharge status: Home / Self Care | Payer: Medicare Other | Source: Ambulatory Visit | Attending: Radiation Oncology | Admitting: Radiation Oncology

## 2018-06-07 DIAGNOSIS — Z510 Encounter for antineoplastic radiation therapy: Secondary | ICD-10-CM | POA: Diagnosis not present

## 2018-06-07 DIAGNOSIS — C61 Malignant neoplasm of prostate: Secondary | ICD-10-CM

## 2018-06-09 DIAGNOSIS — H02831 Dermatochalasis of right upper eyelid: Secondary | ICD-10-CM | POA: Diagnosis not present

## 2018-06-09 DIAGNOSIS — E785 Hyperlipidemia, unspecified: Secondary | ICD-10-CM | POA: Diagnosis not present

## 2018-06-09 DIAGNOSIS — H26492 Other secondary cataract, left eye: Secondary | ICD-10-CM | POA: Diagnosis not present

## 2018-06-09 DIAGNOSIS — H25811 Combined forms of age-related cataract, right eye: Secondary | ICD-10-CM | POA: Insufficient documentation

## 2018-06-09 DIAGNOSIS — H52221 Regular astigmatism, right eye: Secondary | ICD-10-CM | POA: Diagnosis not present

## 2018-06-09 DIAGNOSIS — M1990 Unspecified osteoarthritis, unspecified site: Secondary | ICD-10-CM | POA: Diagnosis not present

## 2018-06-09 DIAGNOSIS — H527 Unspecified disorder of refraction: Secondary | ICD-10-CM | POA: Diagnosis not present

## 2018-06-09 DIAGNOSIS — E669 Obesity, unspecified: Secondary | ICD-10-CM | POA: Diagnosis not present

## 2018-06-09 DIAGNOSIS — C61 Malignant neoplasm of prostate: Secondary | ICD-10-CM | POA: Diagnosis not present

## 2018-06-09 DIAGNOSIS — Z87891 Personal history of nicotine dependence: Secondary | ICD-10-CM | POA: Diagnosis not present

## 2018-06-09 DIAGNOSIS — I10 Essential (primary) hypertension: Secondary | ICD-10-CM | POA: Diagnosis not present

## 2018-06-09 DIAGNOSIS — H02834 Dermatochalasis of left upper eyelid: Secondary | ICD-10-CM | POA: Diagnosis not present

## 2018-06-09 NOTE — Addendum Note (Signed)
Encounter addended by: Tyler Pita, MD on: 06/09/2018 1:25 PM  Actions taken: Clinical Note Signed

## 2018-06-09 NOTE — Progress Notes (Signed)
  Radiation Oncology         [phone]) [phone] ________________________________  Name: Scott Hill MRN: [id number]  Date: 06/07/2018  DOB: 1945/08/14  SIMULATION AND TREATMENT PLANNING NOTE    ICD-10-CM   1. Malignant neoplasm of prostate ([place]) C61     DIAGNOSIS:  73 y.o. gentleman with Stage T1c adenocarcinoma of the prostate with Gleason score of 3+4, and PSA of 8.15  NARRATIVE:  The patient was brought to the [place].  Identity was confirmed.  All relevant records and images related to the planned course of therapy were reviewed.  The patient freely provided informed written consent to proceed with treatment after reviewing the details related to the planned course of therapy. The consent form was witnessed and verified by the simulation staff.  Then, the patient was set-up in a stable reproducible supine position for radiation therapy.  A vacuum lock pillow device was custom fabricated to position his legs in a reproducible immobilized position.  Then, I performed a urethrogram under sterile conditions to identify the prostatic apex.  CT images were obtained.  Surface markings were placed.  The CT images were loaded into the planning software.  Then the prostate target and avoidance structures including the rectum, bladder, bowel and hips were contoured.  Treatment planning then occurred.  The radiation prescription was entered and confirmed.  A total of one complex treatment devices was fabricated. I have requested : Intensity Modulated Radiotherapy (IMRT) is medically necessary for this case for the following reason:  Rectal sparing.Marland Kitchen  PLAN:  The patient will receive 70 Gy in 28 fractions.  ________________________________  Sheral Apley Tammi Klippel, M.D.

## 2018-06-10 DIAGNOSIS — Z510 Encounter for antineoplastic radiation therapy: Secondary | ICD-10-CM | POA: Diagnosis not present

## 2018-06-10 DIAGNOSIS — C61 Malignant neoplasm of prostate: Secondary | ICD-10-CM | POA: Diagnosis not present

## 2018-06-10 DIAGNOSIS — H02834 Dermatochalasis of left upper eyelid: Secondary | ICD-10-CM | POA: Diagnosis not present

## 2018-06-10 DIAGNOSIS — Z961 Presence of intraocular lens: Secondary | ICD-10-CM | POA: Diagnosis not present

## 2018-06-10 DIAGNOSIS — H527 Unspecified disorder of refraction: Secondary | ICD-10-CM | POA: Diagnosis not present

## 2018-06-10 DIAGNOSIS — H02831 Dermatochalasis of right upper eyelid: Secondary | ICD-10-CM | POA: Diagnosis not present

## 2018-06-10 DIAGNOSIS — H26492 Other secondary cataract, left eye: Secondary | ICD-10-CM | POA: Diagnosis not present

## 2018-06-16 ENCOUNTER — Ambulatory Visit
Admission: RE | Admit: 2018-06-16 | Discharge: 2018-06-16 | Disposition: A | Discharge status: Home / Self Care | Payer: Medicare Other | Source: Ambulatory Visit | Attending: Radiation Oncology | Admitting: Radiation Oncology

## 2018-06-16 ENCOUNTER — Encounter: Payer: Self-pay | Admitting: Medical Oncology

## 2018-06-16 ENCOUNTER — Other Ambulatory Visit: Payer: Self-pay

## 2018-06-16 DIAGNOSIS — C61 Malignant neoplasm of prostate: Secondary | ICD-10-CM | POA: Diagnosis not present

## 2018-06-16 DIAGNOSIS — Z510 Encounter for antineoplastic radiation therapy: Secondary | ICD-10-CM | POA: Diagnosis not present

## 2018-06-17 ENCOUNTER — Other Ambulatory Visit: Payer: Self-pay

## 2018-06-17 ENCOUNTER — Ambulatory Visit
Admission: RE | Admit: 2018-06-17 | Discharge: 2018-06-17 | Disposition: A | Discharge status: Home / Self Care | Payer: Medicare Other | Source: Ambulatory Visit | Attending: Radiation Oncology | Admitting: Radiation Oncology

## 2018-06-17 DIAGNOSIS — Z510 Encounter for antineoplastic radiation therapy: Secondary | ICD-10-CM | POA: Diagnosis not present

## 2018-06-17 DIAGNOSIS — C61 Malignant neoplasm of prostate: Secondary | ICD-10-CM | POA: Diagnosis not present

## 2018-06-20 ENCOUNTER — Other Ambulatory Visit: Payer: Self-pay

## 2018-06-20 ENCOUNTER — Ambulatory Visit
Admission: RE | Admit: 2018-06-20 | Discharge: 2018-06-20 | Disposition: A | Discharge status: Home / Self Care | Payer: Medicare Other | Source: Ambulatory Visit | Attending: Radiation Oncology | Admitting: Radiation Oncology

## 2018-06-20 DIAGNOSIS — Z510 Encounter for antineoplastic radiation therapy: Secondary | ICD-10-CM | POA: Diagnosis not present

## 2018-06-20 DIAGNOSIS — C61 Malignant neoplasm of prostate: Secondary | ICD-10-CM | POA: Diagnosis not present

## 2018-06-21 ENCOUNTER — Ambulatory Visit
Admission: RE | Admit: 2018-06-21 | Discharge: 2018-06-21 | Disposition: A | Discharge status: Home / Self Care | Payer: Medicare Other | Source: Ambulatory Visit | Attending: Radiation Oncology | Admitting: Radiation Oncology

## 2018-06-21 ENCOUNTER — Other Ambulatory Visit: Payer: Self-pay

## 2018-06-21 DIAGNOSIS — C61 Malignant neoplasm of prostate: Secondary | ICD-10-CM | POA: Diagnosis not present

## 2018-06-21 DIAGNOSIS — Z510 Encounter for antineoplastic radiation therapy: Secondary | ICD-10-CM | POA: Diagnosis not present

## 2018-06-22 ENCOUNTER — Telehealth: Payer: Self-pay | Admitting: *Deleted

## 2018-06-22 ENCOUNTER — Other Ambulatory Visit: Payer: Self-pay

## 2018-06-22 ENCOUNTER — Ambulatory Visit
Admission: RE | Admit: 2018-06-22 | Discharge: 2018-06-22 | Disposition: A | Discharge status: Home / Self Care | Payer: Medicare Other | Source: Ambulatory Visit | Attending: Radiation Oncology | Admitting: Radiation Oncology

## 2018-06-22 DIAGNOSIS — Z510 Encounter for antineoplastic radiation therapy: Secondary | ICD-10-CM | POA: Diagnosis not present

## 2018-06-22 DIAGNOSIS — C61 Malignant neoplasm of prostate: Secondary | ICD-10-CM | POA: Diagnosis not present

## 2018-06-22 NOTE — Telephone Encounter (Signed)
CALLED PATIENT TO INFORM THAT MRI HAS BEEN CANCELLED PER DR. MANNING REQUEST, NOTIFIED THAT WHEN SIM HAPPENED THAT THEY WAS ABLE TO SEE SPACE OAR AND THEREFORE MRI ISN'T NEEDED, PATIENT VERIFIED UNDERSTANDING THIS

## 2018-06-22 NOTE — Telephone Encounter (Signed)
CALLED PATIENT TO INFORM OF MRI FOR 06-27-18 - ARRIVAL TIME - 8:30 AM @ [facility], NO RESTRICTIONS TO TEST, SPOKE WITH PATIENT AND HE IS AWARE OF THIS TEST

## 2018-06-23 ENCOUNTER — Ambulatory Visit
Admission: RE | Admit: 2018-06-23 | Discharge: 2018-06-23 | Disposition: A | Discharge status: Home / Self Care | Payer: Medicare Other | Source: Ambulatory Visit | Attending: Radiation Oncology | Admitting: Radiation Oncology

## 2018-06-23 DIAGNOSIS — Z510 Encounter for antineoplastic radiation therapy: Secondary | ICD-10-CM | POA: Diagnosis not present

## 2018-06-23 DIAGNOSIS — C61 Malignant neoplasm of prostate: Secondary | ICD-10-CM | POA: Diagnosis not present

## 2018-06-24 ENCOUNTER — Other Ambulatory Visit: Payer: Self-pay

## 2018-06-24 ENCOUNTER — Ambulatory Visit
Admission: RE | Admit: 2018-06-24 | Discharge: 2018-06-24 | Disposition: A | Discharge status: Home / Self Care | Payer: Medicare Other | Source: Ambulatory Visit | Attending: Radiation Oncology | Admitting: Radiation Oncology

## 2018-06-24 DIAGNOSIS — C61 Malignant neoplasm of prostate: Secondary | ICD-10-CM | POA: Diagnosis not present

## 2018-06-24 DIAGNOSIS — Z510 Encounter for antineoplastic radiation therapy: Secondary | ICD-10-CM | POA: Diagnosis not present

## 2018-06-27 ENCOUNTER — Ambulatory Visit (HOSPITAL_COMMUNITY): Payer: Medicare Other

## 2018-06-27 ENCOUNTER — Ambulatory Visit
Admission: RE | Admit: 2018-06-27 | Discharge: 2018-06-27 | Disposition: A | Discharge status: Home / Self Care | Payer: Medicare Other | Source: Ambulatory Visit | Attending: Radiation Oncology | Admitting: Radiation Oncology

## 2018-06-27 ENCOUNTER — Other Ambulatory Visit: Payer: Self-pay

## 2018-06-27 DIAGNOSIS — Z510 Encounter for antineoplastic radiation therapy: Secondary | ICD-10-CM | POA: Diagnosis not present

## 2018-06-27 DIAGNOSIS — C61 Malignant neoplasm of prostate: Secondary | ICD-10-CM | POA: Diagnosis not present

## 2018-06-28 ENCOUNTER — Other Ambulatory Visit: Payer: Self-pay

## 2018-06-28 ENCOUNTER — Ambulatory Visit
Admission: RE | Admit: 2018-06-28 | Discharge: 2018-06-28 | Disposition: A | Discharge status: Home / Self Care | Payer: Medicare Other | Source: Ambulatory Visit | Attending: Radiation Oncology | Admitting: Radiation Oncology

## 2018-06-28 DIAGNOSIS — C61 Malignant neoplasm of prostate: Secondary | ICD-10-CM | POA: Diagnosis not present

## 2018-06-28 DIAGNOSIS — Z510 Encounter for antineoplastic radiation therapy: Secondary | ICD-10-CM | POA: Diagnosis not present

## 2018-06-29 ENCOUNTER — Ambulatory Visit
Admission: RE | Admit: 2018-06-29 | Discharge: 2018-06-29 | Disposition: A | Discharge status: Home / Self Care | Payer: Medicare Other | Source: Ambulatory Visit | Attending: Radiation Oncology | Admitting: Radiation Oncology

## 2018-06-29 ENCOUNTER — Other Ambulatory Visit: Payer: Self-pay

## 2018-06-29 DIAGNOSIS — Z510 Encounter for antineoplastic radiation therapy: Secondary | ICD-10-CM | POA: Diagnosis not present

## 2018-06-29 DIAGNOSIS — C61 Malignant neoplasm of prostate: Secondary | ICD-10-CM | POA: Diagnosis not present

## 2018-06-30 ENCOUNTER — Other Ambulatory Visit: Payer: Self-pay

## 2018-06-30 ENCOUNTER — Ambulatory Visit
Admission: RE | Admit: 2018-06-30 | Discharge: 2018-06-30 | Disposition: A | Discharge status: Home / Self Care | Payer: Medicare Other | Source: Ambulatory Visit | Attending: Radiation Oncology | Admitting: Radiation Oncology

## 2018-06-30 DIAGNOSIS — Z510 Encounter for antineoplastic radiation therapy: Secondary | ICD-10-CM | POA: Diagnosis not present

## 2018-06-30 DIAGNOSIS — C61 Malignant neoplasm of prostate: Secondary | ICD-10-CM | POA: Diagnosis not present

## 2018-07-01 ENCOUNTER — Other Ambulatory Visit: Payer: Self-pay

## 2018-07-01 ENCOUNTER — Ambulatory Visit
Admission: RE | Admit: 2018-07-01 | Discharge: 2018-07-01 | Disposition: A | Discharge status: Home / Self Care | Payer: Medicare Other | Source: Ambulatory Visit | Attending: Radiation Oncology | Admitting: Radiation Oncology

## 2018-07-01 DIAGNOSIS — Z510 Encounter for antineoplastic radiation therapy: Secondary | ICD-10-CM | POA: Diagnosis not present

## 2018-07-01 DIAGNOSIS — C61 Malignant neoplasm of prostate: Secondary | ICD-10-CM | POA: Diagnosis not present

## 2018-07-04 ENCOUNTER — Ambulatory Visit
Admission: RE | Admit: 2018-07-04 | Discharge: 2018-07-04 | Disposition: A | Discharge status: Home / Self Care | Payer: Medicare Other | Source: Ambulatory Visit | Attending: Radiation Oncology | Admitting: Radiation Oncology

## 2018-07-04 ENCOUNTER — Other Ambulatory Visit: Payer: Self-pay

## 2018-07-04 DIAGNOSIS — C61 Malignant neoplasm of prostate: Secondary | ICD-10-CM | POA: Diagnosis not present

## 2018-07-04 DIAGNOSIS — Z510 Encounter for antineoplastic radiation therapy: Secondary | ICD-10-CM | POA: Diagnosis not present

## 2018-07-05 ENCOUNTER — Other Ambulatory Visit: Payer: Self-pay

## 2018-07-05 ENCOUNTER — Ambulatory Visit
Admission: RE | Admit: 2018-07-05 | Discharge: 2018-07-05 | Disposition: A | Discharge status: Home / Self Care | Payer: Medicare Other | Source: Ambulatory Visit | Attending: Radiation Oncology | Admitting: Radiation Oncology

## 2018-07-05 DIAGNOSIS — C61 Malignant neoplasm of prostate: Secondary | ICD-10-CM | POA: Diagnosis not present

## 2018-07-05 DIAGNOSIS — Z510 Encounter for antineoplastic radiation therapy: Secondary | ICD-10-CM | POA: Diagnosis not present

## 2018-07-06 ENCOUNTER — Ambulatory Visit
Admission: RE | Admit: 2018-07-06 | Discharge: 2018-07-06 | Disposition: A | Discharge status: Home / Self Care | Payer: Medicare Other | Source: Ambulatory Visit | Attending: Radiation Oncology | Admitting: Radiation Oncology

## 2018-07-06 ENCOUNTER — Other Ambulatory Visit: Payer: Self-pay

## 2018-07-06 DIAGNOSIS — C61 Malignant neoplasm of prostate: Secondary | ICD-10-CM | POA: Diagnosis not present

## 2018-07-06 DIAGNOSIS — Z510 Encounter for antineoplastic radiation therapy: Secondary | ICD-10-CM | POA: Diagnosis not present

## 2018-07-07 ENCOUNTER — Other Ambulatory Visit: Payer: Self-pay

## 2018-07-07 ENCOUNTER — Ambulatory Visit
Admission: RE | Admit: 2018-07-07 | Discharge: 2018-07-07 | Disposition: A | Discharge status: Home / Self Care | Payer: Medicare Other | Source: Ambulatory Visit | Attending: Radiation Oncology | Admitting: Radiation Oncology

## 2018-07-07 DIAGNOSIS — Z510 Encounter for antineoplastic radiation therapy: Secondary | ICD-10-CM | POA: Diagnosis not present

## 2018-07-07 DIAGNOSIS — C61 Malignant neoplasm of prostate: Secondary | ICD-10-CM | POA: Diagnosis not present

## 2018-07-08 ENCOUNTER — Other Ambulatory Visit: Payer: Self-pay

## 2018-07-08 ENCOUNTER — Ambulatory Visit
Admission: RE | Admit: 2018-07-08 | Discharge: 2018-07-08 | Disposition: A | Discharge status: Home / Self Care | Payer: Medicare Other | Source: Ambulatory Visit | Attending: Radiation Oncology | Admitting: Radiation Oncology

## 2018-07-08 DIAGNOSIS — C61 Malignant neoplasm of prostate: Secondary | ICD-10-CM | POA: Diagnosis not present

## 2018-07-08 DIAGNOSIS — Z510 Encounter for antineoplastic radiation therapy: Secondary | ICD-10-CM | POA: Diagnosis not present

## 2018-07-11 ENCOUNTER — Other Ambulatory Visit: Payer: Self-pay

## 2018-07-11 ENCOUNTER — Ambulatory Visit
Admission: RE | Admit: 2018-07-11 | Discharge: 2018-07-11 | Disposition: A | Discharge status: Home / Self Care | Payer: Medicare Other | Source: Ambulatory Visit | Attending: Radiation Oncology | Admitting: Radiation Oncology

## 2018-07-11 DIAGNOSIS — Z510 Encounter for antineoplastic radiation therapy: Secondary | ICD-10-CM | POA: Diagnosis not present

## 2018-07-11 DIAGNOSIS — C61 Malignant neoplasm of prostate: Secondary | ICD-10-CM | POA: Diagnosis not present

## 2018-07-12 ENCOUNTER — Ambulatory Visit
Admission: RE | Admit: 2018-07-12 | Discharge: 2018-07-12 | Disposition: A | Discharge status: Home / Self Care | Payer: Medicare Other | Source: Ambulatory Visit | Attending: Radiation Oncology | Admitting: Radiation Oncology

## 2018-07-12 ENCOUNTER — Other Ambulatory Visit: Payer: Self-pay

## 2018-07-12 DIAGNOSIS — C61 Malignant neoplasm of prostate: Secondary | ICD-10-CM | POA: Diagnosis not present

## 2018-07-12 DIAGNOSIS — Z510 Encounter for antineoplastic radiation therapy: Secondary | ICD-10-CM | POA: Diagnosis not present

## 2018-07-13 ENCOUNTER — Other Ambulatory Visit: Payer: Self-pay

## 2018-07-13 ENCOUNTER — Ambulatory Visit
Admission: RE | Admit: 2018-07-13 | Discharge: 2018-07-13 | Disposition: A | Discharge status: Home / Self Care | Payer: Medicare Other | Source: Ambulatory Visit | Attending: Radiation Oncology | Admitting: Radiation Oncology

## 2018-07-13 DIAGNOSIS — Z510 Encounter for antineoplastic radiation therapy: Secondary | ICD-10-CM | POA: Diagnosis not present

## 2018-07-13 DIAGNOSIS — C61 Malignant neoplasm of prostate: Secondary | ICD-10-CM | POA: Diagnosis not present

## 2018-07-14 ENCOUNTER — Other Ambulatory Visit: Payer: Self-pay

## 2018-07-14 ENCOUNTER — Ambulatory Visit
Admission: RE | Admit: 2018-07-14 | Discharge: 2018-07-14 | Disposition: A | Discharge status: Home / Self Care | Payer: Medicare Other | Source: Ambulatory Visit | Attending: Radiation Oncology | Admitting: Radiation Oncology

## 2018-07-14 DIAGNOSIS — C61 Malignant neoplasm of prostate: Secondary | ICD-10-CM | POA: Diagnosis not present

## 2018-07-14 DIAGNOSIS — Z510 Encounter for antineoplastic radiation therapy: Secondary | ICD-10-CM | POA: Diagnosis not present

## 2018-07-15 ENCOUNTER — Ambulatory Visit
Admission: RE | Admit: 2018-07-15 | Discharge: 2018-07-15 | Disposition: A | Discharge status: Home / Self Care | Payer: Medicare Other | Source: Ambulatory Visit | Attending: Radiation Oncology | Admitting: Radiation Oncology

## 2018-07-15 ENCOUNTER — Other Ambulatory Visit: Payer: Self-pay

## 2018-07-15 DIAGNOSIS — C61 Malignant neoplasm of prostate: Secondary | ICD-10-CM | POA: Diagnosis not present

## 2018-07-15 DIAGNOSIS — Z510 Encounter for antineoplastic radiation therapy: Secondary | ICD-10-CM | POA: Diagnosis not present

## 2018-07-18 ENCOUNTER — Other Ambulatory Visit: Payer: Self-pay

## 2018-07-18 ENCOUNTER — Ambulatory Visit
Admission: RE | Admit: 2018-07-18 | Discharge: 2018-07-18 | Disposition: A | Discharge status: Home / Self Care | Payer: Medicare Other | Source: Ambulatory Visit | Attending: Radiation Oncology | Admitting: Radiation Oncology

## 2018-07-18 DIAGNOSIS — C61 Malignant neoplasm of prostate: Secondary | ICD-10-CM | POA: Diagnosis not present

## 2018-07-18 DIAGNOSIS — Z510 Encounter for antineoplastic radiation therapy: Secondary | ICD-10-CM | POA: Diagnosis not present

## 2018-07-19 ENCOUNTER — Other Ambulatory Visit: Payer: Self-pay

## 2018-07-19 ENCOUNTER — Ambulatory Visit
Admission: RE | Admit: 2018-07-19 | Discharge: 2018-07-19 | Disposition: A | Discharge status: Home / Self Care | Payer: Medicare Other | Source: Ambulatory Visit | Attending: Radiation Oncology | Admitting: Radiation Oncology

## 2018-07-19 DIAGNOSIS — Z510 Encounter for antineoplastic radiation therapy: Secondary | ICD-10-CM | POA: Diagnosis not present

## 2018-07-19 DIAGNOSIS — C61 Malignant neoplasm of prostate: Secondary | ICD-10-CM | POA: Diagnosis not present

## 2018-07-20 ENCOUNTER — Other Ambulatory Visit: Payer: Self-pay

## 2018-07-20 ENCOUNTER — Ambulatory Visit
Admission: RE | Admit: 2018-07-20 | Discharge: 2018-07-20 | Disposition: A | Discharge status: Home / Self Care | Payer: Medicare Other | Source: Ambulatory Visit | Attending: Radiation Oncology | Admitting: Radiation Oncology

## 2018-07-20 DIAGNOSIS — Z510 Encounter for antineoplastic radiation therapy: Secondary | ICD-10-CM | POA: Diagnosis not present

## 2018-07-20 DIAGNOSIS — C61 Malignant neoplasm of prostate: Secondary | ICD-10-CM | POA: Diagnosis not present

## 2018-07-21 ENCOUNTER — Other Ambulatory Visit: Payer: Self-pay

## 2018-07-21 ENCOUNTER — Ambulatory Visit
Admission: RE | Admit: 2018-07-21 | Discharge: 2018-07-21 | Disposition: A | Discharge status: Home / Self Care | Payer: Medicare Other | Source: Ambulatory Visit | Attending: Radiation Oncology | Admitting: Radiation Oncology

## 2018-07-21 DIAGNOSIS — Z510 Encounter for antineoplastic radiation therapy: Secondary | ICD-10-CM | POA: Diagnosis not present

## 2018-07-21 DIAGNOSIS — C61 Malignant neoplasm of prostate: Secondary | ICD-10-CM | POA: Diagnosis not present

## 2018-07-22 ENCOUNTER — Other Ambulatory Visit: Payer: Self-pay

## 2018-07-22 ENCOUNTER — Ambulatory Visit
Admission: RE | Admit: 2018-07-22 | Discharge: 2018-07-22 | Disposition: A | Discharge status: Home / Self Care | Payer: Medicare Other | Source: Ambulatory Visit | Attending: Radiation Oncology | Admitting: Radiation Oncology

## 2018-07-22 ENCOUNTER — Ambulatory Visit: Admission: RE | Admit: 2018-07-22 | Payer: Medicare Other | Source: Ambulatory Visit

## 2018-07-22 DIAGNOSIS — Z510 Encounter for antineoplastic radiation therapy: Secondary | ICD-10-CM | POA: Diagnosis not present

## 2018-07-22 DIAGNOSIS — C61 Malignant neoplasm of prostate: Secondary | ICD-10-CM | POA: Diagnosis not present

## 2018-07-25 ENCOUNTER — Ambulatory Visit: Payer: Medicare Other

## 2018-07-25 ENCOUNTER — Encounter: Payer: Self-pay | Admitting: Medical Oncology

## 2018-07-25 ENCOUNTER — Other Ambulatory Visit: Payer: Self-pay

## 2018-07-25 ENCOUNTER — Ambulatory Visit
Admission: RE | Admit: 2018-07-25 | Discharge: 2018-07-25 | Disposition: A | Discharge status: Home / Self Care | Payer: Medicare Other | Source: Ambulatory Visit | Attending: Radiation Oncology | Admitting: Radiation Oncology

## 2018-07-25 DIAGNOSIS — Z510 Encounter for antineoplastic radiation therapy: Secondary | ICD-10-CM | POA: Diagnosis not present

## 2018-07-25 DIAGNOSIS — C61 Malignant neoplasm of prostate: Secondary | ICD-10-CM | POA: Diagnosis not present

## 2018-07-26 ENCOUNTER — Ambulatory Visit: Payer: Medicare Other

## 2018-08-12 ENCOUNTER — Encounter: Payer: Self-pay | Admitting: Radiation Oncology

## 2018-08-12 NOTE — Progress Notes (Signed)
  Radiation Oncology         [id number]) [phone] ________________________________  Name: Scott Hill MRN: [id number]  Date: 08/12/2018  DOB: [birth year]  End of Treatment Note  Diagnosis:   73 y.o. gentleman with Stage T1c adenocarcinoma of the prostate with Gleason score of 3+4, and PSA of 8.15     Indication for treatment:  Curative, Definitive Radiotherapy       Radiation treatment dates:   06/16/2018 - 07/25/2018  Site/dose:   The prostate was treated to 70 Gy in 28 fractions of 2.5 Gy  Beams/energy:   The patient was treated with IMRT using volumetric arc therapy delivering 6 MV X-rays to clockwise and counterclockwise circumferential arcs with a 90 degree collimator offset to avoid dose scalloping.  Image guidance was performed with daily cone beam CT prior to each fraction to align to gold markers in the prostate and assure proper bladder and rectal fill volumes.  Immobilization was achieved with BodyFix custom mold.  Narrative: The patient tolerated radiation treatment relatively well. He reported increased nocturia, up from x2-3 to x7-8, that returned to his baseline by the end of treatment. He experienced modest bladder irritation with dysuria, occaqsional scant hematuria, incomplete bladder emptying, urinary urgency with some leakage, loose bowels, fatigue, and weak stream.  Plan: The patient has completed radiation treatment. He will return to radiation oncology clinic for routine followup in one month. I advised him to call or return sooner if he has any questions or concerns related to his recovery or treatment. ________________________________  Sheral Apley. Tammi Klippel, M.D.  This document serves as a record of services personally performed by Tyler Pita, MD. It was created on his behalf by Wilburn Mylar, a trained medical scribe. The creation of this record is based on the scribe's personal observations and the provider's statements to them. This document has been checked and  approved by the attending provider.

## 2018-10-25 DIAGNOSIS — C61 Malignant neoplasm of prostate: Secondary | ICD-10-CM | POA: Diagnosis not present

## 2018-11-07 DIAGNOSIS — Z8546 Personal history of malignant neoplasm of prostate: Secondary | ICD-10-CM | POA: Diagnosis not present

## 2018-11-07 DIAGNOSIS — N5201 Erectile dysfunction due to arterial insufficiency: Secondary | ICD-10-CM | POA: Diagnosis not present

## 2018-12-06 DIAGNOSIS — L720 Epidermal cyst: Secondary | ICD-10-CM | POA: Diagnosis not present

## 2018-12-13 DIAGNOSIS — L720 Epidermal cyst: Secondary | ICD-10-CM | POA: Diagnosis not present

## 2018-12-21 DIAGNOSIS — Z23 Encounter for immunization: Secondary | ICD-10-CM | POA: Diagnosis not present

## 2019-02-03 ENCOUNTER — Other Ambulatory Visit (HOSPITAL_BASED_OUTPATIENT_CLINIC_OR_DEPARTMENT_OTHER): Payer: Self-pay | Admitting: Family Medicine

## 2019-02-03 DIAGNOSIS — E785 Hyperlipidemia, unspecified: Secondary | ICD-10-CM | POA: Diagnosis not present

## 2019-02-03 DIAGNOSIS — Z86010 Personal history of colonic polyps: Secondary | ICD-10-CM | POA: Diagnosis not present

## 2019-02-03 DIAGNOSIS — I719 Aortic aneurysm of unspecified site, without rupture: Secondary | ICD-10-CM

## 2019-02-03 DIAGNOSIS — Z6832 Body mass index (BMI) 32.0-32.9, adult: Secondary | ICD-10-CM | POA: Diagnosis not present

## 2019-02-03 DIAGNOSIS — Z0000 Encounter for general adult medical examination without abnormal findings: Secondary | ICD-10-CM | POA: Diagnosis not present

## 2019-02-03 DIAGNOSIS — Z8546 Personal history of malignant neoplasm of prostate: Secondary | ICD-10-CM | POA: Diagnosis not present

## 2019-02-15 ENCOUNTER — Ambulatory Visit
Admission: RE | Admit: 2019-02-15 | Discharge: 2019-02-15 | Disposition: A | Discharge status: Home / Self Care | Payer: Medicare Other | Source: Ambulatory Visit | Attending: Family Medicine | Admitting: Family Medicine

## 2019-02-15 DIAGNOSIS — I714 Abdominal aortic aneurysm, without rupture: Secondary | ICD-10-CM | POA: Diagnosis not present

## 2019-02-15 DIAGNOSIS — I719 Aortic aneurysm of unspecified site, without rupture: Secondary | ICD-10-CM

## 2019-03-07 ENCOUNTER — Other Ambulatory Visit: Payer: Self-pay | Admitting: Family Medicine

## 2019-03-07 DIAGNOSIS — I714 Abdominal aortic aneurysm, without rupture, unspecified: Secondary | ICD-10-CM

## 2019-03-15 ENCOUNTER — Other Ambulatory Visit: Payer: Medicare Other

## 2019-03-22 ENCOUNTER — Other Ambulatory Visit: Payer: Medicare Other

## 2019-03-29 ENCOUNTER — Other Ambulatory Visit: Payer: Medicare Other

## 2019-04-05 ENCOUNTER — Ambulatory Visit
Admission: RE | Admit: 2019-04-05 | Discharge: 2019-04-05 | Disposition: A | Discharge status: Home / Self Care | Payer: Medicare Other | Source: Ambulatory Visit | Attending: Family Medicine | Admitting: Family Medicine

## 2019-04-05 DIAGNOSIS — I714 Abdominal aortic aneurysm, without rupture, unspecified: Secondary | ICD-10-CM

## 2019-04-05 MED ORDER — IOPAMIDOL (ISOVUE-370) INJECTION 76%
75.0000 mL | Freq: Once | INTRAVENOUS | Status: AC | PRN
  Administered 2019-04-05: 12:00:00 75 mL via INTRAVENOUS

## 2019-04-19 ENCOUNTER — Other Ambulatory Visit: Payer: Self-pay

## 2019-04-19 ENCOUNTER — Ambulatory Visit (INDEPENDENT_AMBULATORY_CARE_PROVIDER_SITE_OTHER): Payer: Medicare Other | Admitting: Vascular Surgery

## 2019-04-19 ENCOUNTER — Encounter: Payer: Self-pay | Admitting: Vascular Surgery

## 2019-04-19 VITALS — BP 161/85 | HR 64 | Temp 97.7°F | Resp 18 | Ht 69.0 in | Wt 224.0 lb

## 2019-04-19 DIAGNOSIS — I714 Abdominal aortic aneurysm, without rupture, unspecified: Secondary | ICD-10-CM

## 2019-04-19 NOTE — Progress Notes (Signed)
Referring Physician: Dr Kathyrn Lass  Patient name: Scott Hill MRN: [id number] DOB: 1945-07-21 Sex: male  REASON FOR CONSULT: Abdominal aortic aneurysm  HPI: Scott Hill is a 74 y.o. male, referred for asymptomatic 4.5 cm abdominal aortic aneurysm. He has no back or abdominal pain. He has no family history of abdominal aortic aneurysm. The aneurysm was initially 3.1 cm in diameter in 2014. It was 3.2 cm in diameter in 2017. Other medical problems include hyperlipidemia hypertension prostate cancer. These are all currently stable. He had taken aspirin in the past but this was discontinued recently by his primary care provider as he was thought to be low risk overall. He is on a statin. He is a former smoker but quit many years ago.  Past Medical History:  Diagnosis Date  . Arthritis   . GERD (gastroesophageal reflux disease)    occasionally  . Hyperlipemia   . Hypertension   . Kidney stones   . Prostate cancer [facility])    Past Surgical History:  Procedure Laterality Date  . CHOLECYSTECTOMY    . fatty tumors     begein,numerous  . KNEE SURGERY     cartledge removed  . TOTAL KNEE ARTHROPLASTY  11/24/2011   left knee  . TOTAL KNEE ARTHROPLASTY  11/24/2011   Procedure: TOTAL KNEE ARTHROPLASTY;  Surgeon: Hessie Dibble, MD;  Location: [place];  Service: Orthopedics;  Laterality: Left;  with revision tibia    Family History  Problem Relation Age of Onset  . Diabetes Mother   . Cancer Father   . Diabetes Sister   . Cancer Paternal Aunt   . Cancer Paternal Uncle   . Cancer Paternal Aunt   . Cancer Paternal Aunt   . Cancer Paternal Aunt   . Cancer Paternal Aunt   . Cancer Paternal Uncle   . Cancer Paternal Uncle   . Cancer Paternal Uncle   . Cancer Paternal Uncle   . Anemia Neg Hx   . Arrhythmia Neg Hx   . Asthma Neg Hx   . Clotting disorder Neg Hx   . Fainting Neg Hx   . Heart attack Neg Hx   . Heart disease Neg Hx   . Heart failure Neg Hx   . Hyperlipidemia Neg Hx    . Hypertension Neg Hx     SOCIAL HISTORY: Social History   Socioeconomic History  . Marital status: Married    Spouse name: Not on file  . Number of children: 0  . Years of education: Not on file  . Highest education level: Not on file  Occupational History    Comment: retired  Tobacco Use  . Smoking status: Former Smoker    Packs/day: 1.00    Years: 30.00    Pack years: 30.00    Types: Cigarettes    Quit date: 03/30/1996    Years since quitting: 23.0  . Smokeless tobacco: Never Used  Substance and Sexual Activity  . Alcohol use: Yes    Alcohol/week: 10.0 standard drinks    Types: 10 Glasses of wine per week    Comment: occassional  . Drug use: No  . Sexual activity: Not Currently  Other Topics Concern  . Not on file  Social History Narrative   Married 45 years (2020).   Social Determinants of Health   Financial Resource Strain:   . Difficulty of Paying Living Expenses: Not on file  Food Insecurity:   . Worried About Charity fundraiser in the Last  Year: Not on file  . Ran Out of Food in the Last Year: Not on file  Transportation Needs:   . Lack of Transportation (Medical): Not on file  . Lack of Transportation (Non-Medical): Not on file  Physical Activity:   . Days of Exercise per Week: Not on file  . Minutes of Exercise per Session: Not on file  Stress:   . Feeling of Stress : Not on file  Social Connections:   . Frequency of Communication with Friends and Family: Not on file  . Frequency of Social Gatherings with Friends and Family: Not on file  . Attends Religious Services: Not on file  . Active Member of Clubs or Organizations: Not on file  . Attends Archivist Meetings: Not on file  . Marital Status: Not on file  Intimate Partner Violence:   . Fear of Current or Ex-Partner: Not on file  . Emotionally Abused: Not on file  . Physically Abused: Not on file  . Sexually Abused: Not on file    No Known Allergies  Current Outpatient  Medications  Medication Sig Dispense Refill  . calcium-vitamin D (OSCAL WITH D) 500-200 MG-UNIT tablet Take 1 tablet by mouth.    . simvastatin (ZOCOR) 40 MG tablet Take by mouth.     No current facility-administered medications for this visit.    ROS:   General:  No weight loss, Fever, chills  HEENT: No recent headaches, no nasal bleeding, no visual changes, no sore throat  Neurologic: No dizziness, blackouts, seizures. No recent symptoms of stroke or mini- stroke. No recent episodes of slurred speech, or temporary blindness.  Cardiac: No recent episodes of chest pain/pressure, no shortness of breath at rest.  No shortness of breath with exertion.  Denies history of atrial fibrillation or irregular heartbeat  Vascular: No history of rest pain in feet.  No history of claudication.  No history of non-healing ulcer, No history of DVT   Pulmonary: No home oxygen, no productive cough, no hemoptysis,  No asthma or wheezing  Musculoskeletal:  [ ]  Arthritis, [X]  Low back pain,  [X]  Joint pain  Hematologic:No history of hypercoagulable state.  No history of easy bleeding.  No history of anemia  Gastrointestinal: No hematochezia or melena,  No gastroesophageal reflux, no trouble swallowing  Urinary: [ ]  chronic Kidney disease, [ ]  on HD - [ ]  MWF or [ ]  TTHS, [ ]  Burning with urination, [ ]  Frequent urination, [ ]  Difficulty urinating;   Skin: No rashes  Psychological: No history of anxiety,  No history of depression   Physical Examination  Vitals:   04/19/19 1251  BP: (!) 161/85  Pulse: 64  Resp: 18  Temp: 97.7 F (36.5 C)  TempSrc: Temporal  SpO2: 96%  Weight: 224 lb (101.6 kg)  Height: 5\' 9"  (1.753 m)    Body mass index is 33.08 kg/m.  General:  Alert and oriented, no acute distress HEENT: Normal Neck: No JVD Cardiac: Regular Rate and Rhythm Abdomen: Soft, non-tender, non-distended, no mass Skin: No rash Extremity Pulses:  2+ radial, brachial, femoral, 2+ right  dorsalis pedis, 2+ left posterior tibial pulse, 3+ right popliteal 2+ left popliteal pulse Musculoskeletal: No deformity trace nonpitting edema left leg Neurologic: Upper and lower extremity motor 5/5 and symmetric  DATA:  I reviewed the images the patient's recent CT scan of the abdomen and pelvis dated April 05, 2019. This shows a 4.5 cm infrarenal abdominal aortic aneurysm. The splenic artery is slightly ectatic  but no obvious aneurysmal degeneration. No iliac involvement.  ASSESSMENT: Asymptomatic 4.5 cm infrarenal abdominal aortic aneurysm. Pathophysiology natural history and course of aneurysms discussed with patient. I discussed with him today that we would consider repair at 5 and half centimeters in diameter.  Of note his right popliteal pulse was a little more full than the left so we probably need to rule out popliteal aneurysms at his next office visit.   PLAN: Ultrasound abdominal aorta 6 months  Ultrasound popliteal arteries 6 months  He will see one of our APP's in clinic at 6 months  Consideration to be given to starting 81 mg aspirin but will defer to his primary care provider for now   Ruta Hinds, MD [facility]: [phone] Pager: [phone]

## 2019-04-21 ENCOUNTER — Other Ambulatory Visit: Payer: Self-pay | Admitting: *Deleted

## 2019-04-21 DIAGNOSIS — I714 Abdominal aortic aneurysm, without rupture, unspecified: Secondary | ICD-10-CM

## 2019-05-02 DIAGNOSIS — Z8546 Personal history of malignant neoplasm of prostate: Secondary | ICD-10-CM | POA: Diagnosis not present

## 2019-05-09 DIAGNOSIS — N5201 Erectile dysfunction due to arterial insufficiency: Secondary | ICD-10-CM | POA: Diagnosis not present

## 2019-05-09 DIAGNOSIS — Z8546 Personal history of malignant neoplasm of prostate: Secondary | ICD-10-CM | POA: Diagnosis not present

## 2019-05-09 DIAGNOSIS — R2243 Localized swelling, mass and lump, lower limb, bilateral: Secondary | ICD-10-CM | POA: Diagnosis not present

## 2019-05-11 ENCOUNTER — Other Ambulatory Visit: Payer: Self-pay | Admitting: Urology

## 2019-05-11 DIAGNOSIS — R2243 Localized swelling, mass and lump, lower limb, bilateral: Secondary | ICD-10-CM

## 2019-05-15 ENCOUNTER — Ambulatory Visit
Admission: RE | Admit: 2019-05-15 | Discharge: 2019-05-15 | Disposition: A | Discharge status: Home / Self Care | Payer: Medicare Other | Source: Ambulatory Visit | Attending: Urology | Admitting: Urology

## 2019-05-15 DIAGNOSIS — M7989 Other specified soft tissue disorders: Secondary | ICD-10-CM | POA: Diagnosis not present

## 2019-05-15 DIAGNOSIS — R2243 Localized swelling, mass and lump, lower limb, bilateral: Secondary | ICD-10-CM

## 2019-05-29 ENCOUNTER — Encounter: Payer: Self-pay | Admitting: Orthopaedic Surgery

## 2019-05-29 ENCOUNTER — Ambulatory Visit (INDEPENDENT_AMBULATORY_CARE_PROVIDER_SITE_OTHER): Payer: Medicare Other

## 2019-05-29 ENCOUNTER — Other Ambulatory Visit: Payer: Self-pay

## 2019-05-29 ENCOUNTER — Ambulatory Visit (INDEPENDENT_AMBULATORY_CARE_PROVIDER_SITE_OTHER): Payer: Medicare Other | Admitting: Orthopaedic Surgery

## 2019-05-29 DIAGNOSIS — R2242 Localized swelling, mass and lump, left lower limb: Secondary | ICD-10-CM

## 2019-05-29 DIAGNOSIS — D179 Benign lipomatous neoplasm, unspecified: Secondary | ICD-10-CM

## 2019-05-29 NOTE — Progress Notes (Signed)
Office Visit Note   Patient: Scott Hill           Date of Birth: [birth year]           MRN: [id number] Visit Date: 05/29/2019              Requested by: Kathyrn Lass, [place],  [place] [id number] PCP: Kathyrn Lass, MD   Assessment & Plan: Visit Diagnoses:  1. Leg mass, left   2. Benign lipomatous neoplasm     Plan: Impression is left calf mass probable lipoma but given size of mass, will obtain an MRI with contrast to further rule out malignancy.  He will follow-up with [place] after the MRI.  Call with concerns or questions in the meantime.  Follow-Up Instructions: Return for follow up 10-14 days MRI review.   Orders:  Orders Placed This Encounter  Procedures  . XR Tibia/Fibula Left  . MR TIBIA FIBULA LEFT W CONTRAST   No orders of the defined types were placed in this encounter.     Procedures: No procedures performed   Clinical Data: No additional findings.   Subjective: Chief Complaint  Patient presents with  . Left Leg - Pain    HPI patient is a pleasant 74 year old gentleman who comes in today with concerns about a mass to his left calf.  He noticed this about a year ago when someone pointed this out while at the gym.  He denies any pain to the left calf.  He does note some tightness.  He feels as though this has increased in size over the past year.  No fevers or chills.  He does note a history of around 20 lipoma excisions throughout his lifetime.  He recently had an ultrasound of the left lower extremity which was negative for DVT but did show a 4.1 cm probable lipoma to the calf.  Review of Systems as detailed in HPI.  All others reviewed and are negative.   Objective: Vital Signs: There were no vitals taken for this visit.  Physical Exam well-developed well-nourished gentleman in no acute distress.  Alert and oriented x3.  Ortho Exam examination of his left calf reveals moderate firmness without pain.  He does have slight tightness  with ankle dorsiflexion.  He is neurovascularly intact distally.  Specialty Comments:  No specialty comments available.  Imaging: XR Tibia/Fibula Left  Result Date: 05/29/2019 X-rays demonstrate a large soft tissue mass in the posterior lower leg extending to popliteal fossa.  He does have evidence of previous left total knee replacement without complication.    PMFS History: Patient Active Problem List   Diagnosis Date Noted  . Malignant neoplasm of prostate ([place]) 05/18/2018  . Regular astigmatism of left eye 01/22/2015  . Essential hypertension 05/12/2013  . Hypercholesteremia 05/12/2013  . Diabetic retinopathy ([place]) 05/12/2013  . Left knee DJD 11/24/2011    Class: Present on Admission   Past Medical History:  Diagnosis Date  . Arthritis   . GERD (gastroesophageal reflux disease)    occasionally  . Hyperlipemia   . Hypertension   . Kidney stones   . Prostate cancer [facility])     Family History  Problem Relation Age of Onset  . Diabetes Mother   . Cancer Father   . Diabetes Sister   . Cancer Paternal Aunt   . Cancer Paternal Uncle   . Cancer Paternal Aunt   . Cancer Paternal Aunt   . Cancer Paternal Aunt   . Cancer  Paternal Aunt   . Cancer Paternal Uncle   . Cancer Paternal Uncle   . Cancer Paternal Uncle   . Cancer Paternal Uncle   . Anemia Neg Hx   . Arrhythmia Neg Hx   . Asthma Neg Hx   . Clotting disorder Neg Hx   . Fainting Neg Hx   . Heart attack Neg Hx   . Heart disease Neg Hx   . Heart failure Neg Hx   . Hyperlipidemia Neg Hx   . Hypertension Neg Hx     Past Surgical History:  Procedure Laterality Date  . CHOLECYSTECTOMY    . fatty tumors     begein,numerous  . KNEE SURGERY     cartledge removed  . TOTAL KNEE ARTHROPLASTY  11/24/2011   left knee  . TOTAL KNEE ARTHROPLASTY  11/24/2011   Procedure: TOTAL KNEE ARTHROPLASTY;  Surgeon: Hessie Dibble, MD;  Location: [place];  Service: Orthopedics;  Laterality: Left;  with revision tibia   Social  History   Occupational History    Comment: retired  Tobacco Use  . Smoking status: Former Smoker    Packs/day: 1.00    Years: 30.00    Pack years: 30.00    Types: Cigarettes    Quit date: 03/30/1996    Years since quitting: 23.1  . Smokeless tobacco: Never Used  Substance and Sexual Activity  . Alcohol use: Yes    Alcohol/week: 10.0 standard drinks    Types: 10 Glasses of wine per week    Comment: occassional  . Drug use: No  . Sexual activity: Not Currently

## 2019-06-01 ENCOUNTER — Telehealth: Payer: Self-pay | Admitting: Orthopaedic Surgery

## 2019-06-01 NOTE — Telephone Encounter (Signed)
05/29/19 ov note faxed [facility]/referring office [phone]

## 2019-06-06 ENCOUNTER — Telehealth: Payer: Self-pay | Admitting: Orthopaedic Surgery

## 2019-06-06 NOTE — Telephone Encounter (Signed)
Pt called in stating Sabrina told him to call back once he had his MRI scheduled to get an appt with Dr. Erlinda Hong. I proceeded to make him an appt for 06/13/19 to go over his MRI with Xu.  [phone]

## 2019-06-09 ENCOUNTER — Ambulatory Visit
Admission: RE | Admit: 2019-06-09 | Discharge: 2019-06-09 | Disposition: A | Discharge status: Home / Self Care | Payer: Medicare Other | Source: Ambulatory Visit | Attending: Physician Assistant | Admitting: Physician Assistant

## 2019-06-09 ENCOUNTER — Other Ambulatory Visit: Payer: Self-pay

## 2019-06-09 DIAGNOSIS — C4922 Malignant neoplasm of connective and soft tissue of left lower limb, including hip: Secondary | ICD-10-CM | POA: Diagnosis not present

## 2019-06-09 DIAGNOSIS — D179 Benign lipomatous neoplasm, unspecified: Secondary | ICD-10-CM

## 2019-06-09 MED ORDER — GADOBENATE DIMEGLUMINE 529 MG/ML IV SOLN
20.0000 mL | Freq: Once | INTRAVENOUS | Status: AC | PRN
  Administered 2019-06-09: 20 mL via INTRAVENOUS

## 2019-06-12 NOTE — Progress Notes (Signed)
Please have him come in tomorrow to discuss

## 2019-06-13 ENCOUNTER — Ambulatory Visit (INDEPENDENT_AMBULATORY_CARE_PROVIDER_SITE_OTHER): Payer: Medicare Other | Admitting: Orthopaedic Surgery

## 2019-06-13 ENCOUNTER — Other Ambulatory Visit: Payer: Self-pay

## 2019-06-13 ENCOUNTER — Encounter: Payer: Self-pay | Admitting: Orthopaedic Surgery

## 2019-06-13 DIAGNOSIS — R2242 Localized swelling, mass and lump, left lower limb: Secondary | ICD-10-CM

## 2019-06-13 NOTE — Progress Notes (Signed)
Office Visit Note   Patient: Scott Hill           Date of Birth: 1946/02/28           MRN: [id number] Visit Date: 06/13/2019              Requested by: Kathyrn Lass, [place],  [place] [id number] PCP: Kathyrn Lass, MD   Assessment & Plan: Visit Diagnoses:  1. Leg mass, left     Plan: MRI is consistent with a large mass concerning for liposarcoma.  This was reviewed with the patient in detail and given the recent findings we will need to make an urgent referral to Dr. Janice Coffin or Magda Bernheim at [facility] for further evaluation and treatment.  Questions encouraged and answered.  Follow-Up Instructions: Return if symptoms worsen or fail to improve.   Orders:  No orders of the defined types were placed in this encounter.  No orders of the defined types were placed in this encounter.     Procedures: No procedures performed   Clinical Data: No additional findings.   Subjective: Chief Complaint  Patient presents with  . Left Knee - Pain  . Left Leg - Pain    Perla returns today for review of MRI of his left leg mass   Review of Systems   Objective: Vital Signs: There were no vitals taken for this visit.  Physical Exam  Ortho Exam Exam is unchanged. Specialty Comments:  No specialty comments available.  Imaging: No results found.   PMFS History: Patient Active Problem List   Diagnosis Date Noted  . Malignant neoplasm of prostate ([place]) 05/18/2018  . Regular astigmatism of left eye 01/22/2015  . Essential hypertension 05/12/2013  . Hypercholesteremia 05/12/2013  . Diabetic retinopathy ([place]) 05/12/2013  . Left knee DJD 11/24/2011    Class: Present on Admission   Past Medical History:  Diagnosis Date  . Arthritis   . GERD (gastroesophageal reflux disease)    occasionally  . Hyperlipemia   . Hypertension   . Kidney stones   . Prostate cancer [facility])     Family History  Problem Relation Age of Onset  .  Diabetes Mother   . Cancer Father   . Diabetes Sister   . Cancer Paternal Aunt   . Cancer Paternal Uncle   . Cancer Paternal Aunt   . Cancer Paternal Aunt   . Cancer Paternal Aunt   . Cancer Paternal Aunt   . Cancer Paternal Uncle   . Cancer Paternal Uncle   . Cancer Paternal Uncle   . Cancer Paternal Uncle   . Anemia Neg Hx   . Arrhythmia Neg Hx   . Asthma Neg Hx   . Clotting disorder Neg Hx   . Fainting Neg Hx   . Heart attack Neg Hx   . Heart disease Neg Hx   . Heart failure Neg Hx   . Hyperlipidemia Neg Hx   . Hypertension Neg Hx     Past Surgical History:  Procedure Laterality Date  . CHOLECYSTECTOMY    . fatty tumors     begein,numerous  . KNEE SURGERY     cartledge removed  . TOTAL KNEE ARTHROPLASTY  11/24/2011   left knee  . TOTAL KNEE ARTHROPLASTY  11/24/2011   Procedure: TOTAL KNEE ARTHROPLASTY;  Surgeon: Hessie Dibble, MD;  Location: [place];  Service: Orthopedics;  Laterality: Left;  with revision tibia   Social History   Occupational  History    Comment: retired  Tobacco Use  . Smoking status: Former Smoker    Packs/day: 1.00    Years: 30.00    Pack years: 30.00    Types: Cigarettes    Quit date: 03/30/1996    Years since quitting: 23.2  . Smokeless tobacco: Never Used  Substance and Sexual Activity  . Alcohol use: Yes    Alcohol/week: 10.0 standard drinks    Types: 10 Glasses of wine per week    Comment: occassional  . Drug use: No  . Sexual activity: Not Currently

## 2019-06-15 ENCOUNTER — Telehealth: Payer: Self-pay | Admitting: Orthopaedic Surgery

## 2019-06-16 ENCOUNTER — Telehealth: Payer: Self-pay | Admitting: Orthopaedic Surgery

## 2019-06-16 NOTE — Telephone Encounter (Signed)
Advised patient CD is ready for pick up.

## 2019-06-16 NOTE — Telephone Encounter (Signed)
Patient called requesting xray on 05/29/19 on a CD. He has appt on 06/20/19. Please call patient to advise when he can pick this up.  [phone]

## 2019-06-20 DIAGNOSIS — R2242 Localized swelling, mass and lump, left lower limb: Secondary | ICD-10-CM | POA: Diagnosis not present

## 2019-06-20 DIAGNOSIS — C499 Malignant neoplasm of connective and soft tissue, unspecified: Secondary | ICD-10-CM | POA: Insufficient documentation

## 2019-07-03 DIAGNOSIS — Z01812 Encounter for preprocedural laboratory examination: Secondary | ICD-10-CM | POA: Diagnosis not present

## 2019-07-03 DIAGNOSIS — Z20822 Contact with and (suspected) exposure to covid-19: Secondary | ICD-10-CM | POA: Diagnosis not present

## 2019-07-03 DIAGNOSIS — R2242 Localized swelling, mass and lump, left lower limb: Secondary | ICD-10-CM | POA: Diagnosis not present

## 2019-07-07 DIAGNOSIS — C4922 Malignant neoplasm of connective and soft tissue of left lower limb, including hip: Secondary | ICD-10-CM | POA: Diagnosis not present

## 2019-07-07 DIAGNOSIS — E785 Hyperlipidemia, unspecified: Secondary | ICD-10-CM | POA: Diagnosis not present

## 2019-07-07 DIAGNOSIS — R2242 Localized swelling, mass and lump, left lower limb: Secondary | ICD-10-CM | POA: Diagnosis not present

## 2019-07-07 DIAGNOSIS — D492 Neoplasm of unspecified behavior of bone, soft tissue, and skin: Secondary | ICD-10-CM | POA: Diagnosis not present

## 2019-07-07 DIAGNOSIS — G8918 Other acute postprocedural pain: Secondary | ICD-10-CM | POA: Diagnosis not present

## 2019-07-07 DIAGNOSIS — Z87891 Personal history of nicotine dependence: Secondary | ICD-10-CM | POA: Diagnosis not present

## 2019-07-25 DIAGNOSIS — Z4802 Encounter for removal of sutures: Secondary | ICD-10-CM | POA: Diagnosis not present

## 2019-08-22 DIAGNOSIS — C4922 Malignant neoplasm of connective and soft tissue of left lower limb, including hip: Secondary | ICD-10-CM | POA: Diagnosis not present

## 2019-08-22 DIAGNOSIS — Z483 Aftercare following surgery for neoplasm: Secondary | ICD-10-CM | POA: Diagnosis not present

## 2019-11-07 DIAGNOSIS — N5201 Erectile dysfunction due to arterial insufficiency: Secondary | ICD-10-CM | POA: Diagnosis not present

## 2019-11-07 DIAGNOSIS — Z8546 Personal history of malignant neoplasm of prostate: Secondary | ICD-10-CM | POA: Diagnosis not present

## 2019-11-23 ENCOUNTER — Other Ambulatory Visit: Payer: Self-pay

## 2019-11-23 DIAGNOSIS — I714 Abdominal aortic aneurysm, without rupture, unspecified: Secondary | ICD-10-CM

## 2019-12-05 ENCOUNTER — Ambulatory Visit (INDEPENDENT_AMBULATORY_CARE_PROVIDER_SITE_OTHER): Payer: Medicare Other | Admitting: Physician Assistant

## 2019-12-05 ENCOUNTER — Other Ambulatory Visit: Payer: Self-pay

## 2019-12-05 ENCOUNTER — Ambulatory Visit (HOSPITAL_COMMUNITY)
Admission: RE | Admit: 2019-12-05 | Discharge: 2019-12-05 | Disposition: A | Discharge status: Home / Self Care | Payer: Medicare Other | Source: Ambulatory Visit | Attending: Surgery | Admitting: Surgery

## 2019-12-05 VITALS — BP 137/87 | HR 56 | Temp 98.1°F | Resp 20 | Ht 69.0 in | Wt 217.6 lb

## 2019-12-05 DIAGNOSIS — I714 Abdominal aortic aneurysm, without rupture, unspecified: Secondary | ICD-10-CM

## 2019-12-05 NOTE — Progress Notes (Signed)
Office Note     CC:  follow up Requesting Provider:  Kathyrn Lass, MD  HPI: Scott Hill is a 74 y.o. (11-13-1945) male who presents for routine 35-month surveillance of infrarenal abdominal aortic aneurysm.  He currently denies back or abdominal pain.  He denies claudication or rest pain.  Earlier this year, he underwent excision of left lower leg lipoma.  He has some mild residual incisional discomfort.  The patient's aneurysm initially measured 3.1 cm in diameter in 2014 and it was 3.2 cm in 2017.  He was evaluated by Dr. Oneida Alar in January of this year and at that time the aneurysm was 4.5 cm by CT.  He has past medical history significant for hyperlipidemia, hypertension and prostate cancer.  He is maintained on statin.  He says he and his primary care physician decided to discontinue 81 mg aspirin.  This has not been restarted.  Former cigarette smoker who quit 1998.   Past Medical History:  Diagnosis Date  . Arthritis   . GERD (gastroesophageal reflux disease)    occasionally  . Hyperlipemia   . Hypertension   . Kidney stones   . Prostate cancer [facility])     Past Surgical History:  Procedure Laterality Date  . CHOLECYSTECTOMY    . fatty tumors     begein,numerous  . KNEE SURGERY     cartledge removed  . TOTAL KNEE ARTHROPLASTY  11/24/2011   left knee  . TOTAL KNEE ARTHROPLASTY  11/24/2011   Procedure: TOTAL KNEE ARTHROPLASTY;  Surgeon: Hessie Dibble, MD;  Location: [facility];  Service: Orthopedics;  Laterality: Left;  with revision tibia    Social History   Socioeconomic History  . Marital status: Married    Spouse name: Not on file  . Number of children: 0  . Years of education: Not on file  . Highest education level: Not on file  Occupational History    Comment: retired  Tobacco Use  . Smoking status: Former Smoker    Packs/day: 1.00    Years: 30.00    Pack years: 30.00    Types: Cigarettes    Quit date: 03/30/1996    Years since quitting: 23.6  . Smokeless  tobacco: Never Used  Vaping Use  . Vaping Use: Never used  Substance and Sexual Activity  . Alcohol use: Yes    Alcohol/week: 10.0 standard drinks    Types: 10 Glasses of wine per week    Comment: occassional  . Drug use: No  . Sexual activity: Not Currently  Other Topics Concern  . Not on file  Social History Narrative   Married 45 years (2020).   Social Determinants of Health   Financial Resource Strain:   . Difficulty of Paying Living Expenses: Not on file  Food Insecurity:   . Worried About Charity fundraiser in the Last Year: Not on file  . Ran Out of Food in the Last Year: Not on file  Transportation Needs:   . Lack of Transportation (Medical): Not on file  . Lack of Transportation (Non-Medical): Not on file  Physical Activity:   . Days of Exercise per Week: Not on file  . Minutes of Exercise per Session: Not on file  Stress:   . Feeling of Stress : Not on file  Social Connections:   . Frequency of Communication with Friends and Family: Not on file  . Frequency of Social Gatherings with Friends and Family: Not on file  . Attends Religious Services: Not  on file  . Active Member of Clubs or Organizations: Not on file  . Attends Archivist Meetings: Not on file  . Marital Status: Not on file  Intimate Partner Violence:   . Fear of Current or Ex-Partner: Not on file  . Emotionally Abused: Not on file  . Physically Abused: Not on file  . Sexually Abused: Not on file   Family History  Problem Relation Age of Onset  . Diabetes Mother   . Cancer Father   . Diabetes Sister   . Cancer Paternal Aunt   . Cancer Paternal Uncle   . Cancer Paternal Aunt   . Cancer Paternal Aunt   . Cancer Paternal Aunt   . Cancer Paternal Aunt   . Cancer Paternal Uncle   . Cancer Paternal Uncle   . Cancer Paternal Uncle   . Cancer Paternal Uncle   . Anemia Neg Hx   . Arrhythmia Neg Hx   . Asthma Neg Hx   . Clotting disorder Neg Hx   . Fainting Neg Hx   . Heart attack  Neg Hx   . Heart disease Neg Hx   . Heart failure Neg Hx   . Hyperlipidemia Neg Hx   . Hypertension Neg Hx     Current Outpatient Medications  Medication Sig Dispense Refill  . acetaminophen (TYLENOL) 325 MG tablet Take by mouth.    Marland Kitchen amoxicillin (AMOXIL) 500 MG capsule     . calcium-vitamin D (OSCAL WITH D) 500-200 MG-UNIT tablet Take 1 tablet by mouth.    . simvastatin (ZOCOR) 20 MG tablet     . tadalafil (CIALIS) 20 MG tablet      No current facility-administered medications for this visit.    No Known Allergies   REVIEW OF SYSTEMS:   [X]  denotes positive finding, [ ]  denotes negative finding Cardiac  Comments:  Chest pain or chest pressure:    Shortness of breath upon exertion:    Short of breath when lying flat:    Irregular heart rhythm:        Vascular    Pain in calf, thigh, or hip brought on by ambulation:    Pain in feet at night that wakes you up from your sleep:     Blood clot in your veins:    Leg swelling:         Pulmonary    Oxygen at home:    Productive cough:     Wheezing:         Neurologic    Sudden weakness in arms or legs:     Sudden numbness in arms or legs:     Sudden onset of difficulty speaking or slurred speech:    Temporary loss of vision in one eye:     Problems with dizziness:         Gastrointestinal    Blood in stool:     Vomited blood:         Genitourinary    Burning when urinating:     Blood in urine:        Psychiatric    Major depression:         Hematologic    Bleeding problems:    Problems with blood clotting too easily:        Skin    Rashes or ulcers:        Constitutional    Fever or chills:      PHYSICAL EXAMINATION:  Vitals:   12/05/19  0929  BP: 137/87  Pulse: (!) 56  Resp: 20  Temp: 98.1 F (36.7 C)  SpO2: 99%   General:  WDWN in NAD; vital signs documented above Gait: Unaided, no ataxia HENT: WNL, normocephalic Pulmonary: normal non-labored breathing , without Rales, rhonchi,   wheezing Cardiac: regular HR, without  Murmurs  Abdomen: soft, NT, no masses Skin: without rashes Vascular Exam/Pulses: The patient has 2+ bilateral brachial, radial and popliteal pulses. Extremities: without ischemic changes, without Gangrene , without cellulitis; without open wounds;  Musculoskeletal: no muscle wasting or atrophy  Neurologic: A&O X 3;  No focal weakness or paresthesias are detected Psychiatric:  The pt has Normal affect.   Non-Invasive Vascular Imaging:   12/05/2019 Abdominal Aorta: There is evidence of abnormal dilatation of the mid  Abdominal aorta. The largest aortic measurement is 4.8 cm.   ASSESSMENT/PLAN:: 74 y.o. male here for follow up for infrarenal AAA.  He is asymptomatic.  We discussed findings of enlargement of his aneurysm as compared to January 2021.  There were physical exam findings of discordant popliteal pulses by Dr. Oneida Alar, however the patient was not scheduled for lower extremity duplex.  I explained that the lower extremity duplex study he had an February was venous and the popliteal arteries were not interrogated.  I explained we could do this at his earliest convenience and follow-up with results by phone.  Continue statin, blood pressure control.  Advised him to call the emergency room for sudden severe back or abdominal pain.  We will follow-up in 6 months with ultrasound of the aorta.  Barbie Banner, PA-C [facility] [phone]  Clinic MD:   Carlis Abbott

## 2019-12-07 ENCOUNTER — Other Ambulatory Visit: Payer: Self-pay | Admitting: *Deleted

## 2019-12-07 DIAGNOSIS — I714 Abdominal aortic aneurysm, without rupture, unspecified: Secondary | ICD-10-CM

## 2019-12-28 DIAGNOSIS — Z23 Encounter for immunization: Secondary | ICD-10-CM | POA: Diagnosis not present

## 2020-01-22 DIAGNOSIS — Z23 Encounter for immunization: Secondary | ICD-10-CM | POA: Diagnosis not present

## 2020-04-07 IMAGING — MR MR [PERSON_NAME] LOW WO/W CM*L*
4 of 8 series · 10 of 40 positions shown · IV contrast (multihance)
Comparison: Left tibia and fibula x-rays dated May 29, 2019.
Bilateral lower extremity venous Doppler ultrasound dated [facility]

CLINICAL DATA: Increasing size of the left calf over the past year.

EXAM:
MRI OF LOWER LEFT EXTREMITY WITHOUT AND WITH CONTRAST
TECHNIQUE: Multiplanar, multisequence MR imaging of the left calf was performed
both before and after administration of intravenous contrast.
Creatinine was obtained on site at [HOSPITAL] at [HOSPITAL].
Results: Creatinine 1.2 mg/dL.
CONTRAST:  20mL MULTIHANCE GADOBENATE DIMEGLUMINE 529 MG/ML IV SOLN

[Series 3: T1 · coronal · left · 3.0mm · 0.89mm/px · 3 of 40 slices shown (1 of 2)]
[im 1/40]
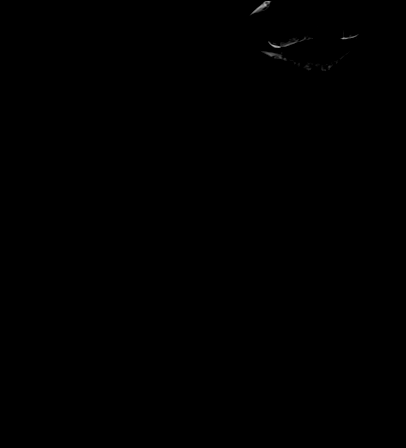
[im 20/40]
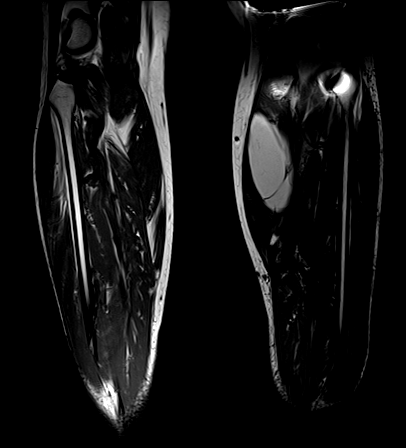
[im 40/40]
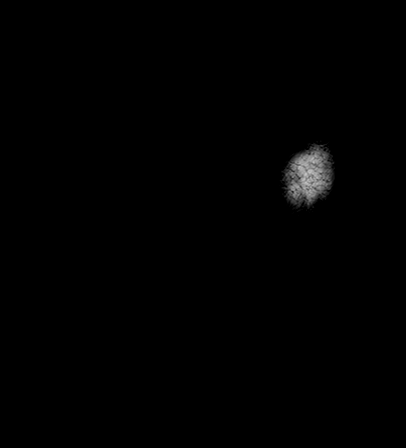

[Series 4: T2 fat-sat · coronal · left · 3.0mm · 0.45mm/px · 3 of 40 slices shown (1 of 2)]
[im 1/40]
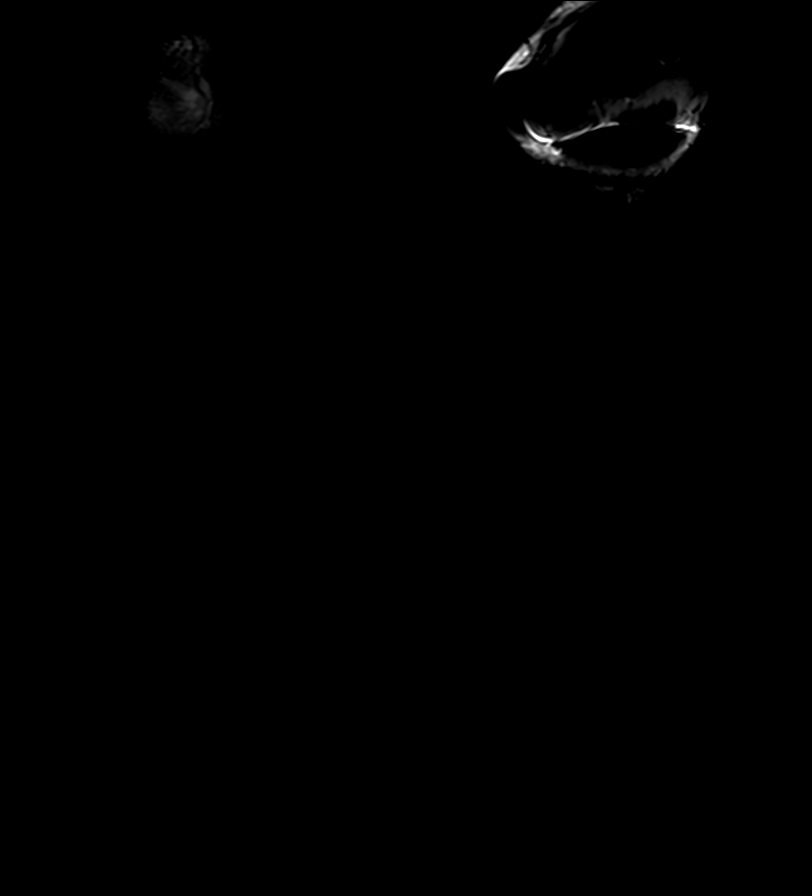
[im 20/40]
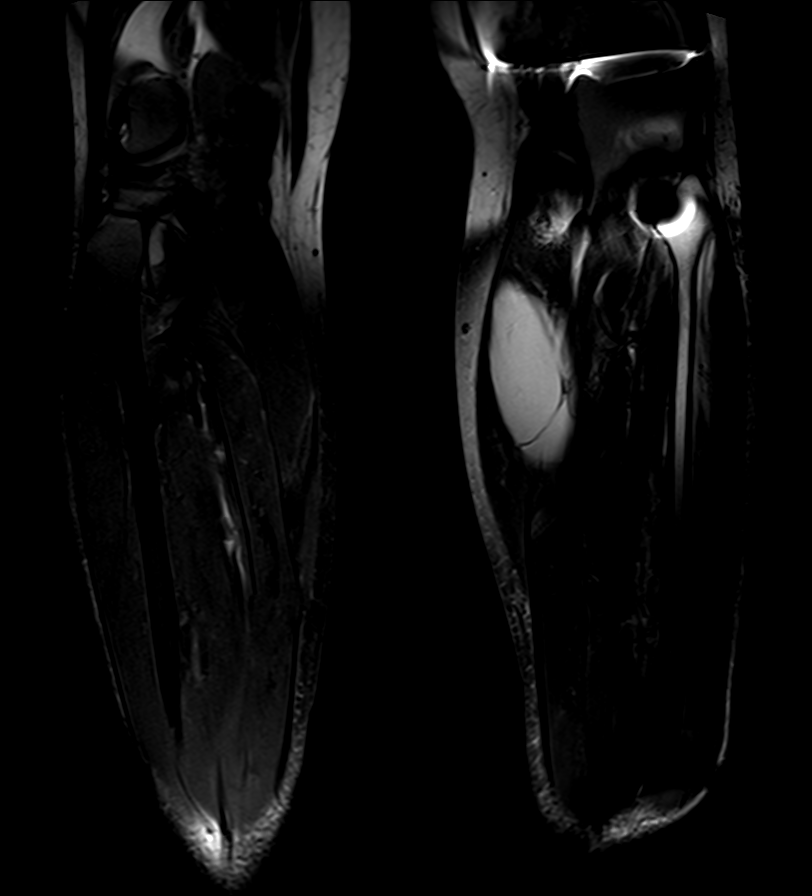
[im 40/40]
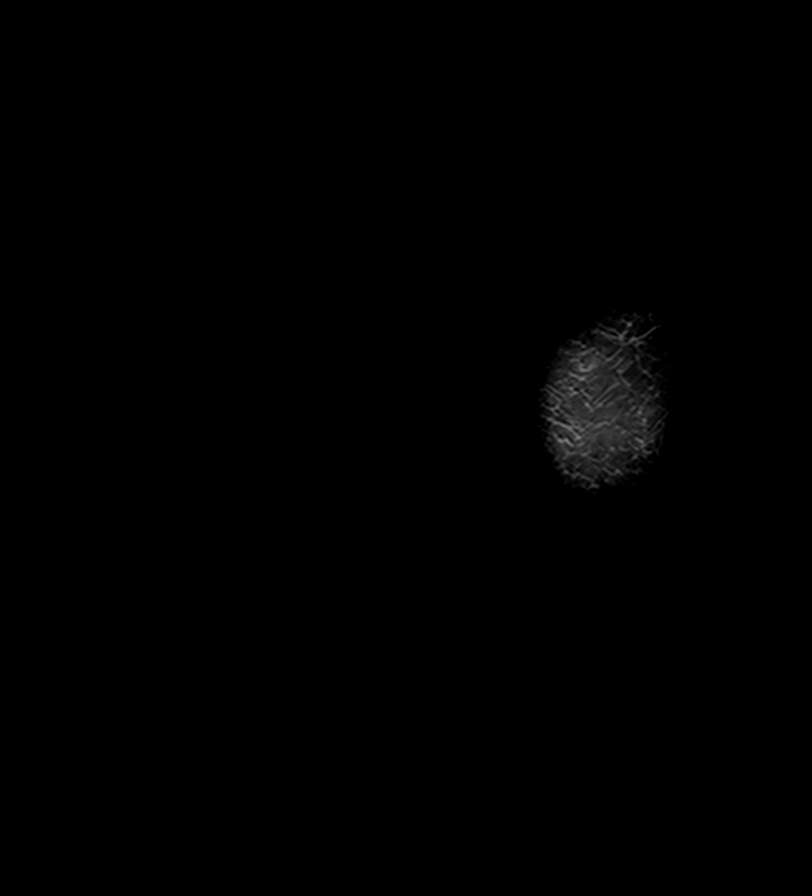

[Series 5: T1 · axial · left · 6.0mm · 0.29mm/px · 1 of 35 slices shown (2 of 2)]
[im 1/35]
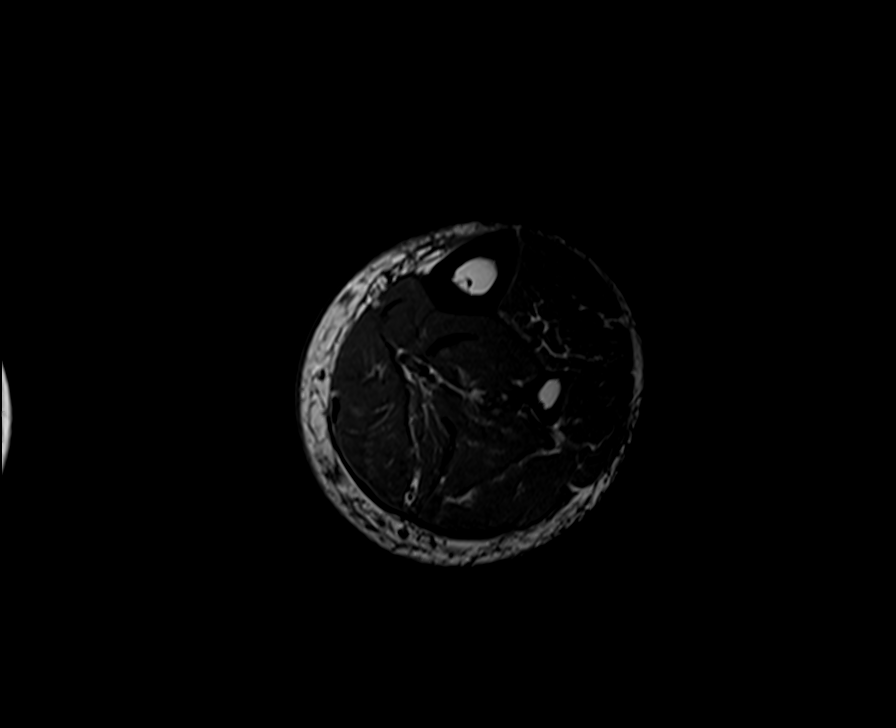

[Series 6: T2 fat-sat · axial · left · 6.0mm · 0.29mm/px · z∈[-84,+181]mm · 3 of 35 slices shown (2 of 2)]
[im 1/35]
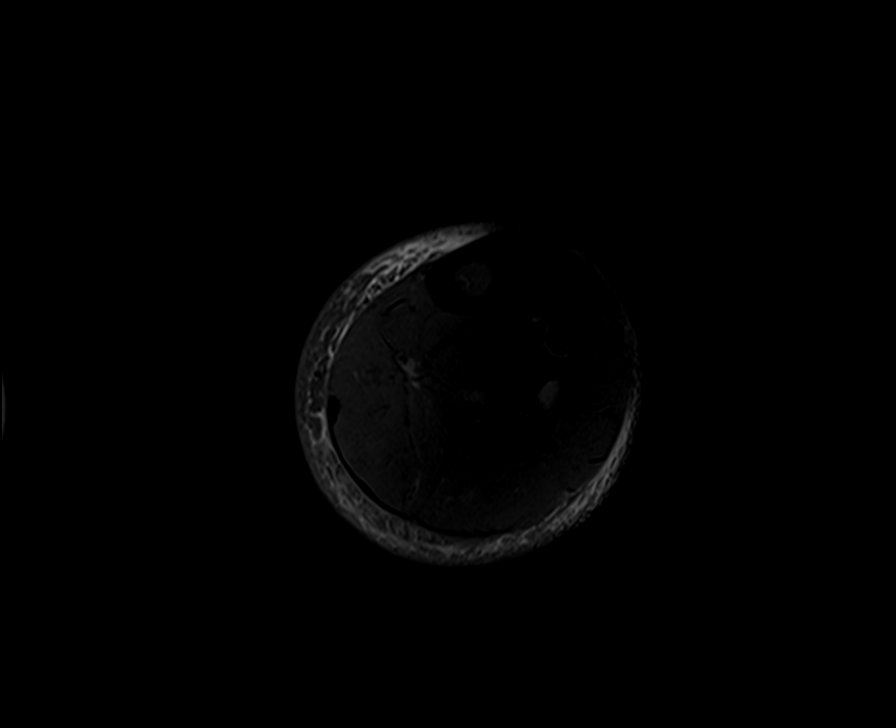
[im 18/35]
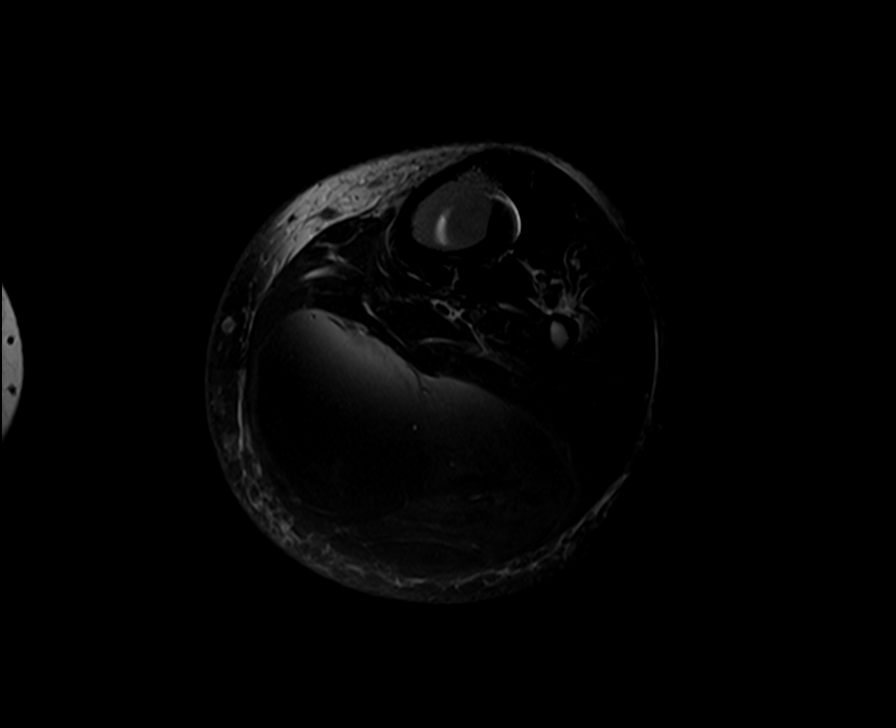
[im 35/35]
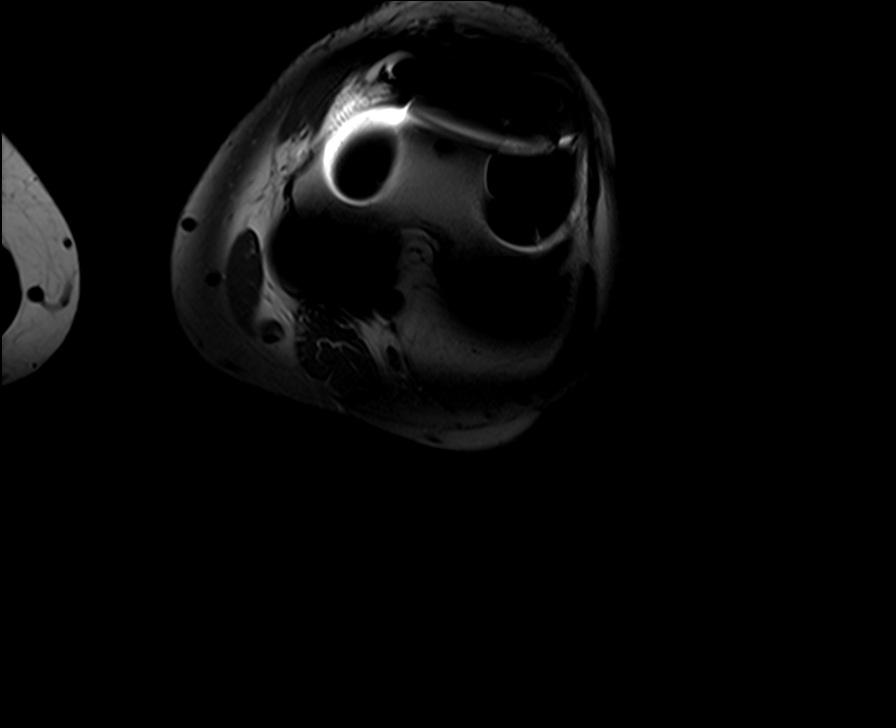

[10 of 40 positions shown; findings below may reference images not displayed]

FINDINGS: Bones/Joint/Cartilage

No marrow signal abnormality. No fracture or dislocation. Prior left
total knee arthroplasty. No joint effusion.

Muscles and Tendons
No muscle edema. Focal areas of fatty atrophy in the right medial
gastrocnemius muscle.

Soft tissue
Large 6.0 x 10.1 x 27.9 cm fat signal intensity mass in the
posterior compartment of the left lower leg, extending superiorly
into the popliteal fossa and above the knee joint. The mass contains
several thin internal septations and a few irregular areas of wispy
T2 hyperintensity with associated enhancement.
IMPRESSION: Large 6.0 x 10.1 x 27.9 cm well-differentiated liposarcoma in the
posterior compartment of the left lower leg, extending superiorly
into the popliteal fossa and above the knee joint.

## 2020-05-03 DIAGNOSIS — Z6832 Body mass index (BMI) 32.0-32.9, adult: Secondary | ICD-10-CM | POA: Diagnosis not present

## 2020-05-03 DIAGNOSIS — E669 Obesity, unspecified: Secondary | ICD-10-CM | POA: Diagnosis not present

## 2020-05-03 DIAGNOSIS — Z8546 Personal history of malignant neoplasm of prostate: Secondary | ICD-10-CM | POA: Diagnosis not present

## 2020-05-03 DIAGNOSIS — I719 Aortic aneurysm of unspecified site, without rupture: Secondary | ICD-10-CM | POA: Diagnosis not present

## 2020-05-03 DIAGNOSIS — R5383 Other fatigue: Secondary | ICD-10-CM | POA: Diagnosis not present

## 2020-05-03 DIAGNOSIS — E785 Hyperlipidemia, unspecified: Secondary | ICD-10-CM | POA: Diagnosis not present

## 2020-05-08 DIAGNOSIS — Z8546 Personal history of malignant neoplasm of prostate: Secondary | ICD-10-CM | POA: Diagnosis not present

## 2020-05-14 DIAGNOSIS — Z8546 Personal history of malignant neoplasm of prostate: Secondary | ICD-10-CM | POA: Diagnosis not present

## 2020-05-14 DIAGNOSIS — N5201 Erectile dysfunction due to arterial insufficiency: Secondary | ICD-10-CM | POA: Diagnosis not present

## 2020-05-28 ENCOUNTER — Other Ambulatory Visit: Payer: Self-pay

## 2020-05-28 DIAGNOSIS — I739 Peripheral vascular disease, unspecified: Secondary | ICD-10-CM

## 2020-06-06 ENCOUNTER — Encounter: Payer: Self-pay | Admitting: Vascular Surgery

## 2020-06-06 ENCOUNTER — Ambulatory Visit (INDEPENDENT_AMBULATORY_CARE_PROVIDER_SITE_OTHER): Payer: Medicare Other | Admitting: Vascular Surgery

## 2020-06-06 ENCOUNTER — Ambulatory Visit (HOSPITAL_COMMUNITY)
Admission: RE | Admit: 2020-06-06 | Discharge: 2020-06-06 | Disposition: A | Discharge status: Home / Self Care | Payer: Medicare Other | Source: Ambulatory Visit | Attending: Vascular Surgery | Admitting: Vascular Surgery

## 2020-06-06 ENCOUNTER — Other Ambulatory Visit: Payer: Self-pay

## 2020-06-06 ENCOUNTER — Ambulatory Visit (INDEPENDENT_AMBULATORY_CARE_PROVIDER_SITE_OTHER)
Admission: RE | Admit: 2020-06-06 | Discharge: 2020-06-06 | Disposition: A | Discharge status: Home / Self Care | Payer: Medicare Other | Source: Ambulatory Visit | Attending: Vascular Surgery | Admitting: Vascular Surgery

## 2020-06-06 VITALS — BP 164/100 | HR 70 | Temp 97.7°F | Resp 20 | Ht 69.0 in | Wt 220.0 lb

## 2020-06-06 DIAGNOSIS — I739 Peripheral vascular disease, unspecified: Secondary | ICD-10-CM | POA: Diagnosis not present

## 2020-06-06 DIAGNOSIS — I714 Abdominal aortic aneurysm, without rupture, unspecified: Secondary | ICD-10-CM

## 2020-06-06 NOTE — Progress Notes (Signed)
Patient is a 75 year old male who returns for follow-up today.  He was last seen September 2021.  We have been following him for a small abdominal aortic aneurysm.  He has no abdominal or back pain.  He was recently thought to have a fairly full popliteal pulse so he returns with a popliteal ultrasound today as well as an aortic ultrasound.  He has no claudication symptoms.  Overall he feels well.  He is a former smoker but has not smoked in several years.  Past Medical History:  Diagnosis Date  . AAA (abdominal aortic aneurysm) ([place])   . Arthritis   . GERD (gastroesophageal reflux disease)    occasionally  . Hyperlipemia   . Hypertension   . Kidney stones   . Prostate cancer [facility])     Review of systems: He has no shortness of breath.  He has no chest pain.  Social History   Socioeconomic History  . Marital status: Married    Spouse name: Not on file  . Number of children: 0  . Years of education: Not on file  . Highest education level: Not on file  Occupational History    Comment: retired  Tobacco Use  . Smoking status: Former Smoker    Packs/day: 1.00    Years: 30.00    Pack years: 30.00    Types: Cigarettes    Quit date: 03/30/1996    Years since quitting: 24.2  . Smokeless tobacco: Never Used  Vaping Use  . Vaping Use: Never used  Substance and Sexual Activity  . Alcohol use: Yes    Alcohol/week: 10.0 standard drinks    Types: 10 Glasses of wine per week    Comment: occassional  . Drug use: No  . Sexual activity: Not Currently  Other Topics Concern  . Not on file  Social History Narrative   Married 45 years (2020).   Social Determinants of Health   Financial Resource Strain: Not on file  Food Insecurity: Not on file  Transportation Needs: Not on file  Physical Activity: Not on file  Stress: Not on file  Social Connections: Not on file  Intimate Partner Violence: Not on file   Physical exam:  Vitals:   06/06/20 0923 06/06/20 0925  BP: (!) 171/103 (!)  164/100  Pulse: 70   Resp: 20   Temp: 97.7 F (36.5 C)   SpO2: 97%   Weight: 220 lb (99.8 kg)   Height: 5\' 9"  (1.753 m)     Extremities 2+ femoral pulses 2+ dorsalis pedis posterior tibial pulses bilaterally  Abdomen: Soft nontender nondistended no obvious pulsatile mass slightly obese  Data: I reviewed the patient's previous ultrasound and medical records which show his aortic diameter was   3.1 cm 2014  3.2 cm 2017  4.5 cm 2021 4.95 cm today  Popliteal ultrasound today shows no popliteal aneurysm.  I reviewed and interpreted the studies.  Assessment: Slowly growing abdominal aortic aneurysm.  Currently asymptomatic.  Plan: Patient needs follow-up aortic ultrasound in 6 months time.  He will be seen in our APP clinic.  Consideration for repair at 5 to 5-1/2 cm in diameter.  Ruta Hinds, MD [facility] [place] Office: [phone]

## 2020-06-14 DIAGNOSIS — Z1211 Encounter for screening for malignant neoplasm of colon: Secondary | ICD-10-CM | POA: Diagnosis not present

## 2020-07-09 DIAGNOSIS — Z23 Encounter for immunization: Secondary | ICD-10-CM | POA: Diagnosis not present

## 2020-08-02 DIAGNOSIS — Z1389 Encounter for screening for other disorder: Secondary | ICD-10-CM | POA: Diagnosis not present

## 2020-08-02 DIAGNOSIS — Z0000 Encounter for general adult medical examination without abnormal findings: Secondary | ICD-10-CM | POA: Diagnosis not present

## 2020-08-29 ENCOUNTER — Other Ambulatory Visit: Payer: Self-pay

## 2020-08-29 DIAGNOSIS — I714 Abdominal aortic aneurysm, without rupture, unspecified: Secondary | ICD-10-CM

## 2020-11-04 DIAGNOSIS — Z8546 Personal history of malignant neoplasm of prostate: Secondary | ICD-10-CM | POA: Diagnosis not present

## 2020-11-11 DIAGNOSIS — N5201 Erectile dysfunction due to arterial insufficiency: Secondary | ICD-10-CM | POA: Diagnosis not present

## 2020-11-11 DIAGNOSIS — Z8546 Personal history of malignant neoplasm of prostate: Secondary | ICD-10-CM | POA: Diagnosis not present

## 2020-12-11 ENCOUNTER — Other Ambulatory Visit: Payer: Self-pay

## 2020-12-11 ENCOUNTER — Ambulatory Visit (INDEPENDENT_AMBULATORY_CARE_PROVIDER_SITE_OTHER): Payer: Medicare Other | Admitting: Physician Assistant

## 2020-12-11 ENCOUNTER — Ambulatory Visit (HOSPITAL_COMMUNITY)
Admission: RE | Admit: 2020-12-11 | Discharge: 2020-12-11 | Disposition: A | Discharge status: Home / Self Care | Payer: Medicare Other | Source: Ambulatory Visit | Attending: Vascular Surgery | Admitting: Vascular Surgery

## 2020-12-11 VITALS — BP 155/93 | HR 65 | Temp 97.2°F | Resp 16 | Ht 70.0 in | Wt 221.0 lb

## 2020-12-11 DIAGNOSIS — I714 Abdominal aortic aneurysm, without rupture, unspecified: Secondary | ICD-10-CM

## 2020-12-11 NOTE — Progress Notes (Signed)
Office Note     CC:  follow up Requesting Provider:  Kathyrn Lass, MD  HPI: Scott Hill is a 75 y.o. (1945/07/13) male who presents with history of infrarenal abdominal aortic aneurysm.  He denies abdominal pain, back pain or lower extremity pain exercise.  He has history of left knee arthroplasty.  He is on no medications for his hypertension.  He is not diabetic.  He is a former smoker.  He takes a daily statin.  Past Medical History:  Diagnosis Date   AAA (abdominal aortic aneurysm) ([facility])    Arthritis    GERD (gastroesophageal reflux disease)    occasionally   Hyperlipemia    Hypertension    Kidney stones    Prostate cancer [facility])     Past Surgical History:  Procedure Laterality Date   CHOLECYSTECTOMY     fatty tumors     begein,numerous   KNEE SURGERY     cartledge removed   TOTAL KNEE ARTHROPLASTY  11/24/2011   left knee   TOTAL KNEE ARTHROPLASTY  11/24/2011   Procedure: TOTAL KNEE ARTHROPLASTY;  Surgeon: Hessie Dibble, MD;  Location: [facility];  Service: Orthopedics;  Laterality: Left;  with revision tibia    Social History   Socioeconomic History   Marital status: Married    Spouse name: Not on file   Number of children: 0   Years of education: Not on file   Highest education level: Not on file  Occupational History    Comment: retired  Tobacco Use   Smoking status: Former    Packs/day: 1.00    Years: 30.00    Pack years: 30.00    Types: Cigarettes    Quit date: 03/30/1996    Years since quitting: 24.7   Smokeless tobacco: Never  Vaping Use   Vaping Use: Never used  Substance and Sexual Activity   Alcohol use: Yes    Alcohol/week: 10.0 standard drinks    Types: 10 Glasses of wine per week    Comment: occassional   Drug use: No   Sexual activity: Not Currently  Other Topics Concern   Not on file  Social History Narrative   Married 45 years (2020).   Social Determinants of Health   Financial Resource Strain: Not on file  Food Insecurity: Not  on file  Transportation Needs: Not on file  Physical Activity: Not on file  Stress: Not on file  Social Connections: Not on file  Intimate Partner Violence: Not on file   Family History  Problem Relation Age of Onset   Diabetes Mother    Cancer Father    Diabetes Sister    Cancer Paternal Aunt    Cancer Paternal Uncle    Cancer Paternal Aunt    Cancer Paternal Aunt    Cancer Paternal Aunt    Cancer Paternal Aunt    Cancer Paternal Uncle    Cancer Paternal Uncle    Cancer Paternal Uncle    Cancer Paternal Uncle    Anemia Neg Hx    Arrhythmia Neg Hx    Asthma Neg Hx    Clotting disorder Neg Hx    Fainting Neg Hx    Heart attack Neg Hx    Heart disease Neg Hx    Heart failure Neg Hx    Hyperlipidemia Neg Hx    Hypertension Neg Hx     Current Outpatient Medications  Medication Sig Dispense Refill   acetaminophen (TYLENOL) 325 MG tablet Take by mouth.  calcium-vitamin D (OSCAL WITH D) 500-200 MG-UNIT tablet Take 1 tablet by mouth.     simvastatin (ZOCOR) 20 MG tablet      tadalafil (CIALIS) 20 MG tablet      amoxicillin (AMOXIL) 500 MG capsule 500 mg as needed. Prior to dental appt (Patient not taking: Reported on 12/11/2020)     No current facility-administered medications for this visit.    No Known Allergies   REVIEW OF SYSTEMS:   '[X]'$  denotes positive finding, '[ ]'$  denotes negative finding Cardiac  Comments:  Chest pain or chest pressure:    Shortness of breath upon exertion:    Short of breath when lying flat:    Irregular heart rhythm:        Vascular    Pain in calf, thigh, or hip brought on by ambulation:    Pain in feet at night that wakes you up from your sleep:     Blood clot in your veins:    Leg swelling:         Pulmonary    Oxygen at home:    Productive cough:     Wheezing:         Neurologic    Sudden weakness in arms or legs:     Sudden numbness in arms or legs:     Sudden onset of difficulty speaking or slurred speech:    Temporary  loss of vision in one eye:     Problems with dizziness:         Gastrointestinal    Blood in stool:     Vomited blood:         Genitourinary    Burning when urinating:     Blood in urine:        Psychiatric    Major depression:         Hematologic    Bleeding problems:    Problems with blood clotting too easily:        Skin    Rashes or ulcers:        Constitutional    Fever or chills:      PHYSICAL EXAMINATION:  Vitals:   12/11/20 0946  BP: (!) 155/93  Pulse: 65  Resp: 16  Temp: (!) 97.2 F (36.2 C)  TempSrc: Temporal  SpO2: 96%  Weight: 221 lb (100.2 kg)  Height: '5\' 10"'$  (1.778 m)    General:  WDWN in NAD; vital signs documented above Gait: Unaided, no ataxia HENT: WNL, normocephalic Pulmonary: normal non-labored breathing , without Rales, rhonchi,  wheezing Cardiac: regular HR, without  Murmurs  Abdomen: soft, NT, no masses Skin: without rashes Vascular Exam/Pulses: Extremities: without ischemic changes, without Gangrene , without cellulitis; without open wounds;  Musculoskeletal: no muscle wasting or atrophy  Neurologic: A&O X 3;  No focal weakness or paresthesias are detected Psychiatric:  The pt has Normal affect.   Non-Invasive Vascular Imaging:   3.1 cm 2014  3.2 cm 2017  4.5 cm 2021 4.95 cm 05/2020 4.91 cm 11/2020   Summary:  Abdominal Aorta: There is evidence of abnormal dilatation of the distal  Abdominal aorta. The largest aortic diameter remains essentially unchanged  compared to prior exam. Previous diameter measurement was obtained on  06/06/20.     *See table(s) above for measurements and observations.      Preliminary     ASSESSMENT/PLAN:: 75 y.o. male here for follow up for abdominal aortic aneurysm.  Maximal diameter of aneurysm is stable as compared to 6  months ago.  The patient is asymptomatic.  We discussed proceeding to the emergency department should he develop sudden significant abdominal pain or back pain.  Follow-up in 6  months with aorta ultrasound.  Barbie Banner, PA-C [facility] [phone]  Clinic MD:   Dr. Donzetta Matters

## 2020-12-12 ENCOUNTER — Other Ambulatory Visit: Payer: Self-pay

## 2020-12-12 DIAGNOSIS — I714 Abdominal aortic aneurysm, without rupture, unspecified: Secondary | ICD-10-CM

## 2020-12-17 DIAGNOSIS — Z23 Encounter for immunization: Secondary | ICD-10-CM | POA: Diagnosis not present

## 2021-05-06 DIAGNOSIS — Z8546 Personal history of malignant neoplasm of prostate: Secondary | ICD-10-CM | POA: Diagnosis not present

## 2021-05-06 DIAGNOSIS — I719 Aortic aneurysm of unspecified site, without rupture: Secondary | ICD-10-CM | POA: Diagnosis not present

## 2021-05-06 DIAGNOSIS — N1831 Chronic kidney disease, stage 3a: Secondary | ICD-10-CM | POA: Diagnosis not present

## 2021-05-06 DIAGNOSIS — E7800 Pure hypercholesterolemia, unspecified: Secondary | ICD-10-CM | POA: Diagnosis not present

## 2021-05-06 DIAGNOSIS — I129 Hypertensive chronic kidney disease with stage 1 through stage 4 chronic kidney disease, or unspecified chronic kidney disease: Secondary | ICD-10-CM | POA: Diagnosis not present

## 2021-05-06 DIAGNOSIS — E669 Obesity, unspecified: Secondary | ICD-10-CM | POA: Diagnosis not present

## 2021-05-13 DIAGNOSIS — Z8546 Personal history of malignant neoplasm of prostate: Secondary | ICD-10-CM | POA: Diagnosis not present

## 2021-05-20 DIAGNOSIS — N5201 Erectile dysfunction due to arterial insufficiency: Secondary | ICD-10-CM | POA: Diagnosis not present

## 2021-05-20 DIAGNOSIS — Z8546 Personal history of malignant neoplasm of prostate: Secondary | ICD-10-CM | POA: Diagnosis not present

## 2021-05-28 ENCOUNTER — Other Ambulatory Visit: Payer: Self-pay

## 2021-05-28 DIAGNOSIS — I714 Abdominal aortic aneurysm, without rupture, unspecified: Secondary | ICD-10-CM

## 2021-06-09 ENCOUNTER — Ambulatory Visit: Payer: Medicare HMO | Admitting: Physician Assistant

## 2021-06-09 ENCOUNTER — Other Ambulatory Visit: Payer: Self-pay

## 2021-06-09 ENCOUNTER — Ambulatory Visit (HOSPITAL_COMMUNITY)
Admission: RE | Admit: 2021-06-09 | Discharge: 2021-06-09 | Disposition: A | Discharge status: Home / Self Care | Payer: Medicare HMO | Source: Ambulatory Visit | Attending: Physician Assistant | Admitting: Physician Assistant

## 2021-06-09 VITALS — BP 147/86 | HR 106 | Temp 97.2°F | Resp 18 | Ht 70.0 in | Wt 226.0 lb

## 2021-06-09 DIAGNOSIS — I714 Abdominal aortic aneurysm, without rupture, unspecified: Secondary | ICD-10-CM

## 2021-06-09 DIAGNOSIS — E119 Type 2 diabetes mellitus without complications: Secondary | ICD-10-CM | POA: Diagnosis not present

## 2021-06-09 DIAGNOSIS — I7143 Infrarenal abdominal aortic aneurysm, without rupture: Secondary | ICD-10-CM

## 2021-06-09 NOTE — Progress Notes (Signed)
?Office Note  ? ? ? ?CC:  follow up ?Requesting Provider:  Kathyrn Lass, MD ? ?HPI: Scott Hill is a 76 y.o. (February 21, 1946) male who presents for surveillance of abdominal aortic aneurysm.  He has been followed over the past decade by this office for a slow-growing AAA.  He denies new or changing abdominal or back pain.  He also denies any claudication, rest pain, or tissue changes of bilateral lower extremities.  He is a former smoker.  Past medical history significant for prostate cancer post-radiation. ? ? ?Past Medical History:  ?Diagnosis Date  ? AAA (abdominal aortic aneurysm)   ? Arthritis   ? GERD (gastroesophageal reflux disease)   ? occasionally  ? Hyperlipemia   ? Hypertension   ? Kidney stones   ? Prostate cancer ([place])   ? ? ?Past Surgical History:  ?Procedure Laterality Date  ? CHOLECYSTECTOMY    ? fatty tumors    ? begein,numerous  ? KNEE SURGERY    ? cartledge removed  ? TOTAL KNEE ARTHROPLASTY  11/24/2011  ? left knee  ? TOTAL KNEE ARTHROPLASTY  11/24/2011  ? Procedure: TOTAL KNEE ARTHROPLASTY;  Surgeon: Hessie Dibble, MD;  Location: [place];  Service: Orthopedics;  Laterality: Left;  with revision tibia  ? ? ?Social History  ? ?Socioeconomic History  ? Marital status: Married  ?  Spouse name: Not on file  ? Number of children: 0  ? Years of education: Not on file  ? Highest education level: Not on file  ?Occupational History  ?  Comment: retired  ?Tobacco Use  ? Smoking status: Former  ?  Packs/day: 1.00  ?  Years: 30.00  ?  Pack years: 30.00  ?  Types: Cigarettes  ?  Quit date: 03/30/1996  ?  Years since quitting: 25.2  ? Smokeless tobacco: Never  ?Vaping Use  ? Vaping Use: Never used  ?Substance and Sexual Activity  ? Alcohol use: Yes  ?  Alcohol/week: 10.0 standard drinks  ?  Types: 10 Glasses of wine per week  ?  Comment: occassional  ? Drug use: No  ? Sexual activity: Not Currently  ?Other Topics Concern  ? Not on file  ?Social History Narrative  ? Married 45 years (2020).  ? ?Social Determinants  of Health  ? ?Financial Resource Strain: Not on file  ?Food Insecurity: Not on file  ?Transportation Needs: Not on file  ?Physical Activity: Not on file  ?Stress: Not on file  ?Social Connections: Not on file  ?Intimate Partner Violence: Not on file  ? ? ?Family History  ?Problem Relation Age of Onset  ? Diabetes Mother   ? Cancer Father   ? Diabetes Sister   ? Cancer Paternal Aunt   ? Cancer Paternal Uncle   ? Cancer Paternal Aunt   ? Cancer Paternal Aunt   ? Cancer Paternal Aunt   ? Cancer Paternal Aunt   ? Cancer Paternal Uncle   ? Cancer Paternal Uncle   ? Cancer Paternal Uncle   ? Cancer Paternal Uncle   ? Anemia Neg Hx   ? Arrhythmia Neg Hx   ? Asthma Neg Hx   ? Clotting disorder Neg Hx   ? Fainting Neg Hx   ? Heart attack Neg Hx   ? Heart disease Neg Hx   ? Heart failure Neg Hx   ? Hyperlipidemia Neg Hx   ? Hypertension Neg Hx   ? ? ?Current Outpatient Medications  ?Medication Sig Dispense Refill  ?  acetaminophen (TYLENOL) 325 MG tablet Take by mouth.    ? calcium-vitamin D (OSCAL WITH D) 500-200 MG-UNIT tablet Take 1 tablet by mouth.    ? simvastatin (ZOCOR) 20 MG tablet     ? tadalafil (CIALIS) 20 MG tablet     ? amoxicillin (AMOXIL) 500 MG capsule 500 mg as needed. Prior to dental appt (Patient not taking: Reported on 12/11/2020)    ? ?No current facility-administered medications for this visit.  ? ? ?No Known Allergies ? ? ?REVIEW OF SYSTEMS:  ? ?'[X]'$  denotes positive finding, '[ ]'$  denotes negative finding ?Cardiac  Comments:  ?Chest pain or chest pressure:    ?Shortness of breath upon exertion:    ?Short of breath when lying flat:    ?Irregular heart rhythm:    ?    ?Vascular    ?Pain in calf, thigh, or hip brought on by ambulation:    ?Pain in feet at night that wakes you up from your sleep:     ?Blood clot in your veins:    ?Leg swelling:     ?    ?Pulmonary    ?Oxygen at home:    ?Productive cough:     ?Wheezing:     ?    ?Neurologic    ?Sudden weakness in arms or legs:     ?Sudden numbness in arms or  legs:     ?Sudden onset of difficulty speaking or slurred speech:    ?Temporary loss of vision in one eye:     ?Problems with dizziness:     ?    ?Gastrointestinal    ?Blood in stool:     ?Vomited blood:     ?    ?Genitourinary    ?Burning when urinating:     ?Blood in urine:    ?    ?Psychiatric    ?Major depression:     ?    ?Hematologic    ?Bleeding problems:    ?Problems with blood clotting too easily:    ?    ?Skin    ?Rashes or ulcers:    ?    ?Constitutional    ?Fever or chills:    ? ? ?PHYSICAL EXAMINATION: ? ?Vitals:  ? 06/09/21 0922  ?BP: (!) 147/86  ?Pulse: (!) 106  ?Resp: 18  ?Temp: (!) 97.2 ?F (36.2 ?C)  ?TempSrc: Temporal  ?SpO2: 97%  ?Weight: 226 lb (102.5 kg)  ?Height: '5\' 10"'$  (1.778 m)  ? ? ?General:  WDWN in NAD; vital signs documented above ?Gait: Not observed ?HENT: WNL, normocephalic ?Pulmonary: normal non-labored breathing , without Rales, rhonchi,  wheezing ?Cardiac: regular HR ?Abdomen: soft, NT, no masses ?Skin: without rashes ?Vascular Exam/Pulses: ? Right Left  ?Radial 2+ (normal) 2+ (normal)  ?DP 2+ (normal) 2+ (normal)  ? ?Extremities: without ischemic changes, without Gangrene , without cellulitis; without open wounds;  ?Musculoskeletal: no muscle wasting or atrophy  ?Neurologic: A&O X 3;  No focal weakness or paresthesias are detected ?Psychiatric:  The pt has Normal affect. ? ? ?Non-Invasive Vascular Imaging:   ?4.99 cm AAA ? ? ? ?ASSESSMENT/PLAN:: 76 y.o. male here for follow up for surveillance of AAA ? ?-AAA duplex demonstrates a slowly growing abdominal aortic aneurysm now measuring 4.99 cm at largest diameter.  CTA from 2021 was reviewed with Dr. Trula Slade today who believes the patient would be a good candidate for endovascular repair of abdominal aortic aneurysm.  Plan will be to repeat CTA chest, abdomen and pelvis in the  next few weeks to then follow-up with Dr. Trula Slade for surgical planning.  Patient is agreeable to the above plan. ? ? ?Dagoberto Ligas, PA-C ?[facility] ?[phone] ? ?Clinic MD:   Trula Slade ? ?

## 2021-06-13 ENCOUNTER — Other Ambulatory Visit: Payer: Self-pay

## 2021-06-13 ENCOUNTER — Ambulatory Visit (HOSPITAL_COMMUNITY)
Admission: RE | Admit: 2021-06-13 | Discharge: 2021-06-13 | Disposition: A | Discharge status: Home / Self Care | Payer: Medicare HMO | Source: Ambulatory Visit | Attending: Surgery | Admitting: Surgery

## 2021-06-13 DIAGNOSIS — I714 Abdominal aortic aneurysm, without rupture, unspecified: Secondary | ICD-10-CM | POA: Diagnosis not present

## 2021-06-13 DIAGNOSIS — I700 Atherosclerosis of aorta: Secondary | ICD-10-CM | POA: Diagnosis not present

## 2021-06-13 DIAGNOSIS — K769 Liver disease, unspecified: Secondary | ICD-10-CM | POA: Diagnosis not present

## 2021-06-13 DIAGNOSIS — K5730 Diverticulosis of large intestine without perforation or abscess without bleeding: Secondary | ICD-10-CM | POA: Diagnosis not present

## 2021-06-13 DIAGNOSIS — I7143 Infrarenal abdominal aortic aneurysm, without rupture: Secondary | ICD-10-CM | POA: Diagnosis not present

## 2021-06-13 LAB — POCT I-STAT CREATININE: Creatinine, Ser: 1.5 mg/dL — ABNORMAL HIGH (ref 0.61–1.24)

## 2021-06-13 MED ORDER — IOHEXOL 350 MG/ML SOLN
80.0000 mL | Freq: Once | INTRAVENOUS | Status: AC | PRN
  Administered 2021-06-13: 80 mL via INTRAVENOUS

## 2021-06-23 ENCOUNTER — Other Ambulatory Visit: Payer: Self-pay

## 2021-06-23 ENCOUNTER — Ambulatory Visit: Payer: Medicare HMO | Admitting: Surgery

## 2021-06-23 ENCOUNTER — Encounter: Payer: Self-pay | Admitting: Surgery

## 2021-06-23 VITALS — BP 150/82 | HR 65 | Temp 98.1°F | Resp 20 | Ht 70.0 in | Wt 229.0 lb

## 2021-06-23 DIAGNOSIS — I7143 Infrarenal abdominal aortic aneurysm, without rupture: Secondary | ICD-10-CM | POA: Diagnosis not present

## 2021-06-23 NOTE — Progress Notes (Signed)
? ?[facility] [place] ? ?Patient name: Scott Hill MRN: [id number] DOB: 01-24-1946 Sex: male ? ? ?REASON FOR VISIT:  ? ? ?Follow up ? ?HISOTRY OF PRESENT ILLNESS:  ? ? ?Scott Hill is a 76 y.o. male who we are following for a abdominal aortic aneurysm.  His last ultrasound showed a 4.99 cm diameter aneurysm.  He was sent for CT scan.  He is back today for follow-up.  He denies abdominal pain or back pain ? ?The patient is a former smoker.  He is status post radiation for prostate cancer.  He takes a statin for hypercholesterolemia. ? ? ?PAST MEDICAL HISTORY:  ? ?Past Medical History:  ?Diagnosis Date  ? AAA (abdominal aortic aneurysm)   ? Arthritis   ? GERD (gastroesophageal reflux disease)   ? occasionally  ? Hyperlipemia   ? Hypertension   ? Kidney stones   ? Prostate cancer ([place])   ? ? ? ?FAMILY HISTORY:  ? ?Family History  ?Problem Relation Age of Onset  ? Diabetes Mother   ? Cancer Father   ? Diabetes Sister   ? Cancer Paternal Aunt   ? Cancer Paternal Uncle   ? Cancer Paternal Aunt   ? Cancer Paternal Aunt   ? Cancer Paternal Aunt   ? Cancer Paternal Aunt   ? Cancer Paternal Uncle   ? Cancer Paternal Uncle   ? Cancer Paternal Uncle   ? Cancer Paternal Uncle   ? Anemia Neg Hx   ? Arrhythmia Neg Hx   ? Asthma Neg Hx   ? Clotting disorder Neg Hx   ? Fainting Neg Hx   ? Heart attack Neg Hx   ? Heart disease Neg Hx   ? Heart failure Neg Hx   ? Hyperlipidemia Neg Hx   ? Hypertension Neg Hx   ? ? ?SOCIAL HISTORY:  ? ?Social History  ? ?Tobacco Use  ? Smoking status: Former  ?  Packs/day: 1.00  ?  Years: 30.00  ?  Pack years: 30.00  ?  Types: Cigarettes  ?  Quit date: 03/30/1996  ?  Years since quitting: 25.2  ? Smokeless tobacco: Never  ?Substance Use Topics  ? Alcohol use: Yes  ?  Alcohol/week: 10.0 standard drinks  ?  Types: 10 Glasses of wine per week  ?  Comment: occassional  ? ? ? ?ALLERGIES:  ? ?No Known Allergies ? ? ?CURRENT MEDICATIONS:  ? ?Current  Outpatient Medications  ?Medication Sig Dispense Refill  ? acetaminophen (TYLENOL) 325 MG tablet Take by mouth.    ? calcium-vitamin D (OSCAL WITH D) 500-200 MG-UNIT tablet Take 1 tablet by mouth.    ? losartan (COZAAR) 50 MG tablet Take 50 mg by mouth daily.    ? simvastatin (ZOCOR) 20 MG tablet     ? tadalafil (CIALIS) 20 MG tablet     ? amoxicillin (AMOXIL) 500 MG capsule 500 mg as needed. Prior to dental appt (Patient not taking: Reported on 12/11/2020)    ? ?No current facility-administered medications for this visit.  ? ? ?REVIEW OF SYSTEMS:  ? ?'[X]'$  denotes positive finding, '[ ]'$  denotes negative finding ?Cardiac  Comments:  ?Chest pain or chest pressure:    ?Shortness of breath upon exertion:    ?Short of breath when lying flat:    ?Irregular heart rhythm:    ?    ?Vascular    ?Pain in calf, thigh, or hip brought on by ambulation:    ?Pain in feet  at night that wakes you up from your sleep:     ?Blood clot in your veins:    ?Leg swelling:     ?    ?Pulmonary    ?Oxygen at home:    ?Productive cough:     ?Wheezing:     ?    ?Neurologic    ?Sudden weakness in arms or legs:     ?Sudden numbness in arms or legs:     ?Sudden onset of difficulty speaking or slurred speech:    ?Temporary loss of vision in one eye:     ?Problems with dizziness:     ?    ?Gastrointestinal    ?Blood in stool:     ?Vomited blood:     ?    ?Genitourinary    ?Burning when urinating:     ?Blood in urine:    ?    ?Psychiatric    ?Major depression:     ?    ?Hematologic    ?Bleeding problems:    ?Problems with blood clotting too easily:    ?    ?Skin    ?Rashes or ulcers:    ?    ?Constitutional    ?Fever or chills:    ? ? ?PHYSICAL EXAM:  ? ?Vitals:  ? 06/23/21 1401  ?BP: (!) 150/82  ?Pulse: 65  ?Resp: 20  ?Temp: 98.1 ?F (36.7 ?C)  ?SpO2: 96%  ?Weight: 229 lb (103.9 kg)  ?Height: '5\' 10"'$  (1.778 m)  ? ? ?GENERAL: The patient is a well-nourished male, in no acute distress. The vital signs are documented above. ?CARDIAC: There is a regular rate  and rhythm.  ?VASCULAR: Palpable right dorsalis pedis pulse, nonpalpable left ?PULMONARY: Non-labored respirations ?ABDOMEN: Soft and non-tender MUSCULOSKELETAL: There are no major deformities or cyanosis. ?NEUROLOGIC: No focal weakness or paresthesias are detected. ?SKIN: There are no ulcers or rashes noted. ?PSYCHIATRIC: The patient has a normal affect. ? ?STUDIES:  ? ?I have reviewed the following: ?1. 5.6 cm infrarenal abdominal aortic aneurysm, previously 5.1 cm. ?Current guidelines recommend referral to a vascular specialist (the ?patient is currently under the care of a vascular ?specialist/surgeon). ?2. 5.4 cm nonspecific right hepatic lesion, previously 3.6 cm in ?2005. 2 smaller segment 2 lesions are also noted. Dedicated ?outpatient liver MR with contrast may be useful for further ?characterization. ?3. Coronary and Aortic Atherosclerosis (ICD10-170.0). ?4. Sigmoid diverticulosis ?MEDICAL ISSUES:  ? ?AAA: Maximum aortic diameter is 5.6 cm.  I discussed that he meets criteria for repair.  I think he would be a good candidate for endovascular treatment.  The details of the operation were discussed with the patient and his wife.  We also discussed the risk which include but are not limited to stroke, cardiac disease, access complications, renal failure, intestinal ischemia.  All of his questions were answered and he wishes to proceed. ? ?Prior to proceeding, I will get preoperative carotid duplex, ABIs and popliteal evaluation for aneurysmal disease.  He will also be referred to cardiology for cardiac clearance.  I would like to get him down within the next month. ? ? ? ?Annamarie Major, IV, MD, FACS ?[facility] [place] ?Tel [phone] ?Pager [phone]  ?

## 2021-06-24 ENCOUNTER — Other Ambulatory Visit: Payer: Self-pay

## 2021-06-24 ENCOUNTER — Ambulatory Visit (INDEPENDENT_AMBULATORY_CARE_PROVIDER_SITE_OTHER)
Admission: RE | Admit: 2021-06-24 | Discharge: 2021-06-24 | Disposition: A | Discharge status: Home / Self Care | Payer: Medicare HMO | Source: Ambulatory Visit | Attending: Surgery | Admitting: Surgery

## 2021-06-24 ENCOUNTER — Other Ambulatory Visit: Payer: Self-pay | Admitting: *Deleted

## 2021-06-24 ENCOUNTER — Ambulatory Visit (HOSPITAL_COMMUNITY)
Admission: RE | Admit: 2021-06-24 | Discharge: 2021-06-24 | Disposition: A | Discharge status: Home / Self Care | Payer: Medicare HMO | Source: Ambulatory Visit | Attending: Surgery | Admitting: Surgery

## 2021-06-24 DIAGNOSIS — I739 Peripheral vascular disease, unspecified: Secondary | ICD-10-CM

## 2021-06-24 DIAGNOSIS — Z01810 Encounter for preprocedural cardiovascular examination: Secondary | ICD-10-CM

## 2021-06-24 DIAGNOSIS — I7143 Infrarenal abdominal aortic aneurysm, without rupture: Secondary | ICD-10-CM

## 2021-06-24 NOTE — Progress Notes (Signed)
?Cardiology Office Note:   ? ?Date:  06/25/2021  ? ?ID:  Scott Hill, DOB [birth year], MRN [id number] ? ?PCP:  Scott Lass, MD  ? ?[facility] ?Cardiologist:  Scott Sciara, MD ?Referring MD: Scott Lass, MD  ? ?Chief Complaint/Reason for Referral: Preoperative cardiac evaluation for endovascular repair of abdominal aortic aneurysm ? ?ASSESSMENT:   ? ?1. Preoperative cardiovascular examination   ?2. Abdominal aortic aneurysm (AAA) without rupture, unspecified part   ?3. Coronary artery calcification seen on CAT scan   ?4. Hypertension, unspecified type   ?5. Hyperlipidemia, unspecified hyperlipidemia type   ? ? ?PLAN:   ? ?In order of problems listed above: ?1.  We will refer for nuclear stress test and echocardiogram.  Only if patient has high risk findings would further cardiac evaluation be necessary. ?2.  Being managed by vascular surgery.  His heart rate is relatively low so I will not start a beta-blocker. ?3.  Start aspirin 81 mg and continue simvastatin. ?4.  His blood pressure is elevated today.  He is to see his PCP tomorrow and plans on increasing his dose. ?5.  Given coronary calcification and peripheral vascular disease goal LDL is less than 70.  We will check lipid panel in the future.. ? ? ?     ? ?Shared Decision Making/Informed Consent ?The risks [chest pain, shortness of breath, cardiac arrhythmias, dizziness, blood pressure fluctuations, myocardial infarction, stroke/transient ischemic attack, nausea, vomiting, allergic reaction, radiation exposure, metallic taste sensation and life-threatening complications (estimated to be 1 in 10,000)], benefits (risk stratification, diagnosing coronary artery disease, treatment guidance) and alternatives of a nuclear stress test were discussed in detail with Scott Hill and he agrees to proceed.  ? ?Dispo:  Return in about 6 months (around 12/26/2021).  ? ?  ? ?Medication Adjustments/Labs and Tests Ordered: ?Current medicines are reviewed at  length with the patient today.  Concerns regarding medicines are outlined above.  ? ?Tests Ordered: ?Orders Placed This Encounter  ?Procedures  ? EKG 12-Lead  ? ? ?Medication Changes: ?No orders of the defined types were placed in this encounter. ? ? ?History of Present Illness:   ? ?FOCUSED PROBLEM LIST:   ?1.  Abdominal aortic aneurysm followed by vascular surgery with planned endovascular repair in the near future ?2.  Hyperlipidemia ?3.  Hypertension ?4.  Right bundle branch block ?5.  Coronary calcification seen on CT scan ? ?The patient is a 76 y.o. male with the indicated medical history here for preoperative cardiac evaluation prior to vascular surgical procedure.  Patient was seen by Dr. Trula Hill recently and a CT scan was performed which demonstrated that he had met the threshold for elective repair. ? ?The patient is doing fairly well.  He is limited by knee pain in regards to mobility but is able to do most of his activities of daily living.  He denies any exertional angina, presyncope, syncope, or exertional dyspnea.  He has had no bleeding or bruising.  He has no abdominal pain.  He has had no back pain.  He is required no emergency room visits or hospitalizations. ? ? ? ?Current Medications: ?Current Meds  ?Medication Sig  ? acetaminophen (TYLENOL) 325 MG tablet Take by mouth.  ? amoxicillin (AMOXIL) 500 MG capsule 500 mg as needed. Prior to dental appt  ? calcium-vitamin D (OSCAL WITH D) 500-200 MG-UNIT tablet Take 1 tablet by mouth.  ? losartan (COZAAR) 50 MG tablet Take 50 mg by mouth daily.  ? simvastatin (ZOCOR) 20 MG tablet  Take 20 mg by mouth daily.  ? [DISCONTINUED] tadalafil (CIALIS) 20 MG tablet   ?  ? ?Allergies:    ?Patient has no known allergies.  ? ?Social History:   ?Social History  ? ?Tobacco Use  ? Smoking status: Former  ?  Packs/day: 1.00  ?  Years: 30.00  ?  Pack years: 30.00  ?  Types: Cigarettes  ?  Quit date: 03/30/1996  ?  Years since quitting: 25.2  ? Smokeless tobacco: Never   ?Vaping Use  ? Vaping Use: Never used  ?Substance Use Topics  ? Alcohol use: Yes  ?  Alcohol/week: 10.0 standard drinks  ?  Types: 10 Glasses of wine per week  ?  Comment: occassional  ? Drug use: No  ?  ? ?Family Hx: ?Family History  ?Problem Relation Age of Onset  ? Diabetes Mother   ? Cancer Father   ? Diabetes Sister   ? Cancer Paternal Aunt   ? Cancer Paternal Uncle   ? Cancer Paternal Aunt   ? Cancer Paternal Aunt   ? Cancer Paternal Aunt   ? Cancer Paternal Aunt   ? Cancer Paternal Uncle   ? Cancer Paternal Uncle   ? Cancer Paternal Uncle   ? Cancer Paternal Uncle   ? Anemia Neg Hx   ? Arrhythmia Neg Hx   ? Asthma Neg Hx   ? Clotting disorder Neg Hx   ? Fainting Neg Hx   ? Heart attack Neg Hx   ? Heart disease Neg Hx   ? Heart failure Neg Hx   ? Hyperlipidemia Neg Hx   ? Hypertension Neg Hx   ?  ? ?Review of Systems:   ?Please see the history of present illness.    ?All other systems reviewed and are negative. ?  ? ? ?EKGs/Labs/Other Test Reviewed:   ? ?EKG: Sinus rhythm with right bundle branch block EKG 2013; today's EKG sinus rhythm with incomplete right bundle branch block ? ?Prior CV studies: ?None available ? ?Imaging studies that I have independently reviewed today:  ? ?CT 2023 ?1. 5.6 cm infrarenal abdominal aortic aneurysm, previously 5.1 cm. ?Current guidelines recommend referral to a vascular specialist (the ?patient is currently under the care of a vascular ?specialist/surgeon). ?2. 5.4 cm nonspecific right hepatic lesion, previously 3.6 cm in ?2005. 2 smaller segment 2 lesions are also noted. Dedicated ?outpatient liver MR with contrast may be useful for further ?characterization. ?3. Coronary and Aortic Atherosclerosis (ICD10-170.0). ?4. Sigmoid diverticulosis ? ?Recent Labs: ?06/13/2021: Creatinine, Ser 1.50  ? ?Recent Lipid Panel ?No results found for: CHOL, TRIG, HDL, LDLCALC, LDLDIRECT ? ?Risk Assessment/Calculations:   ? ?  ?    ? ?Physical Exam:   ? ?VS:  BP (!) 178/88   Pulse (!) 55    Ht _0  (1.778 m)   Wt 228 lb 9.6 oz (103.7 kg)   SpO2 98%   BMI 32.80 kg/m?    ?Wt Readings from Last 3 Encounters:  ?06/25/21 228 lb 9.6 oz (103.7 kg)  ?06/23/21 229 lb (103.9 kg)  ?06/09/21 226 lb (102.5 kg)  ?  ?GENERAL:  No apparent distress, AOx3 ?HEENT:  No carotid bruits, +2 carotid impulses, no scleral icterus ?CAR: RRR no murmurs, gallops, rubs, or thrills ?RES:  Clear to auscultation bilaterally ?ABD:  Soft, nontender, nondistended, positive bowel sounds x 4 ?VASC:  +2 radial pulses, +2 carotid pulses, palpable pedal pulses ?NEURO:  CN 2-12 grossly intact; motor and sensory grossly intact ?PSYCH:  No active depression or anxiety ?EXT:  No edema, ecchymosis, or cyanosis ? ?Signed, ?Early Osmond, MD  ?06/25/2021 11:50 AM    ?[facility] ?[address], [place], [place]  [address] ?Phone: [phone]; Fax: [phone]  ? ?Note:  This document was prepared using Dragon voice recognition software and may include unintentional dictation errors. ?

## 2021-06-25 ENCOUNTER — Ambulatory Visit: Payer: Medicare HMO | Admitting: Internal Medicine

## 2021-06-25 ENCOUNTER — Encounter: Payer: Self-pay | Admitting: *Deleted

## 2021-06-25 ENCOUNTER — Encounter: Payer: Self-pay | Admitting: Internal Medicine

## 2021-06-25 ENCOUNTER — Other Ambulatory Visit: Payer: Self-pay

## 2021-06-25 VITALS — BP 178/88 | HR 55 | Ht 70.0 in | Wt 228.6 lb

## 2021-06-25 DIAGNOSIS — E785 Hyperlipidemia, unspecified: Secondary | ICD-10-CM

## 2021-06-25 DIAGNOSIS — I2510 Atherosclerotic heart disease of native coronary artery without angina pectoris: Secondary | ICD-10-CM

## 2021-06-25 DIAGNOSIS — Z01810 Encounter for preprocedural cardiovascular examination: Secondary | ICD-10-CM | POA: Diagnosis not present

## 2021-06-25 DIAGNOSIS — I10 Essential (primary) hypertension: Secondary | ICD-10-CM

## 2021-06-25 DIAGNOSIS — I714 Abdominal aortic aneurysm, without rupture, unspecified: Secondary | ICD-10-CM | POA: Diagnosis not present

## 2021-06-25 MED ORDER — ASPIRIN EC 81 MG PO TBEC
81.0000 mg | DELAYED_RELEASE_TABLET | Freq: Every day | ORAL | 3 refills | Status: AC
Start: 2021-06-25 — End: ?

## 2021-06-25 NOTE — Patient Instructions (Signed)
Medication Instructions:  ? ?Start aspirin 81 mg - one tablet daily ? ?*If you need a refill on your cardiac medications before your next appointment, please call your pharmacy* ? ? ?Lab Work: ?None ? ? ?Testing/Procedures: ?Your physician has requested that you have an echocardiogram. Echocardiography is a painless test that uses sound waves to create images of your heart. It provides your doctor with information about the size and shape of your heart and how well your heart?s chambers and valves are working. This procedure takes approximately one hour. There are no restrictions for this procedure. ? ?Your physician has requested that you have a lexiscan myoview. For further information please visit [email or web address]. Please follow instruction sheet, as given. ? ? ?Follow-Up: ?At [facility], you and your health needs are our priority.  As part of our continuing mission to provide you with exceptional heart care, we have created designated Provider Care Teams.  These Care Teams include your primary Cardiologist (physician) and Advanced Practice Providers (APPs -  Physician Assistants and Nurse Practitioners) who all work together to provide you with the care you need, when you need it. ? ? ?Your next appointment:   ?6 month(s) ? ?The format for your next appointment:   ?In Person ? ?Provider:   ?Early Osmond, MD   ? ?  ?

## 2021-06-26 DIAGNOSIS — I129 Hypertensive chronic kidney disease with stage 1 through stage 4 chronic kidney disease, or unspecified chronic kidney disease: Secondary | ICD-10-CM | POA: Diagnosis not present

## 2021-06-26 DIAGNOSIS — E7800 Pure hypercholesterolemia, unspecified: Secondary | ICD-10-CM | POA: Diagnosis not present

## 2021-07-09 ENCOUNTER — Telehealth (HOSPITAL_COMMUNITY): Payer: Self-pay | Admitting: *Deleted

## 2021-07-09 NOTE — Telephone Encounter (Signed)
Patient given detailed instructions per Myocardial Perfusion Study Information Sheet for the test on 07/16/21 at 1000. Patient notified to arrive 15 minutes early and that it is imperative to arrive on time for appointment to keep from having the test rescheduled. ? If you need to cancel or reschedule your appointment, please call the office within 24 hours of your appointment. . Patient verbalized understanding.Scott Hill, Ranae Palms ? ? ?

## 2021-07-15 ENCOUNTER — Other Ambulatory Visit (HOSPITAL_COMMUNITY): Payer: Medicare HMO

## 2021-07-16 ENCOUNTER — Ambulatory Visit (HOSPITAL_COMMUNITY): Payer: Medicare HMO | Attending: Internal Medicine

## 2021-07-16 ENCOUNTER — Ambulatory Visit (HOSPITAL_BASED_OUTPATIENT_CLINIC_OR_DEPARTMENT_OTHER): Payer: Medicare HMO

## 2021-07-16 DIAGNOSIS — I10 Essential (primary) hypertension: Secondary | ICD-10-CM | POA: Insufficient documentation

## 2021-07-16 DIAGNOSIS — I2510 Atherosclerotic heart disease of native coronary artery without angina pectoris: Secondary | ICD-10-CM

## 2021-07-16 DIAGNOSIS — Z01810 Encounter for preprocedural cardiovascular examination: Secondary | ICD-10-CM | POA: Insufficient documentation

## 2021-07-16 DIAGNOSIS — E785 Hyperlipidemia, unspecified: Secondary | ICD-10-CM | POA: Diagnosis not present

## 2021-07-16 DIAGNOSIS — I714 Abdominal aortic aneurysm, without rupture, unspecified: Secondary | ICD-10-CM | POA: Diagnosis not present

## 2021-07-16 LAB — MYOCARDIAL PERFUSION IMAGING
LV dias vol: 88 mL (ref 62–150)
LV sys vol: 45 mL
Nuc Stress EF: 48 %
Peak HR: 107 {beats}/min
Rest HR: 59 {beats}/min
Rest Nuclear Isotope Dose: 10.8 mCi
SDS: 0
SRS: 0
SSS: 0
ST Depression (mm): 0 mm
Stress Nuclear Isotope Dose: 29.4 mCi
TID: 1.06

## 2021-07-16 LAB — ECHOCARDIOGRAM COMPLETE
Area-P 1/2: 2.99 cm2
Height: 70 in
S' Lateral: 2.9 cm
Weight: 3648 oz

## 2021-07-16 MED ORDER — REGADENOSON 0.4 MG/5ML IV SOLN
0.4000 mg | Freq: Once | INTRAVENOUS | Status: AC
  Administered 2021-07-16: 0.4 mg via INTRAVENOUS

## 2021-07-16 MED ORDER — TECHNETIUM TC 99M TETROFOSMIN IV KIT
10.8000 | PACK | Freq: Once | INTRAVENOUS | Status: AC | PRN
  Administered 2021-07-16: 10.8 via INTRAVENOUS
  Filled 2021-07-16: qty 11

## 2021-07-16 MED ORDER — TECHNETIUM TC 99M TETROFOSMIN IV KIT
29.4000 | PACK | Freq: Once | INTRAVENOUS | Status: AC | PRN
  Administered 2021-07-16: 29.4 via INTRAVENOUS
  Filled 2021-07-16: qty 30

## 2021-07-18 ENCOUNTER — Other Ambulatory Visit: Payer: Self-pay

## 2021-07-18 DIAGNOSIS — I7143 Infrarenal abdominal aortic aneurysm, without rupture: Secondary | ICD-10-CM

## 2021-07-24 NOTE — Progress Notes (Signed)
Surgical Instructions ? ? ? Your procedure is scheduled on Wednesday May 3. ? Report to [facility] Main Entrance "A" at 6:30 A.M., then check in with the Admitting office. ? Call this number if you have problems the morning of surgery: ? [phone] ? ? If you have any questions prior to your surgery date call [phone]: Open Monday-Friday 8am-4pm ? ? ? Remember: ? Do not eat or drink anything after midnight the night before your surgery ? ?  ? Take these medicines the morning of surgery with A SIP OF WATER:  ?simvastatin (ZOCOR) ? ?As of today, STOP taking any Aspirin (unless otherwise instructed by your surgeon) Aleve, Naproxen, Ibuprofen, Motrin, Advil, Goody's, BC's, all herbal medications, fish oil, and all vitamins. ? ?         ?Do not wear jewelry or makeup ?Do not wear lotions, powders, perfumes/colognes, or deodorant. ?Do not shave 48 hours prior to surgery.  Men may shave face and neck. ?Do not bring valuables to the hospital. ?Do not wear nail polish, gel polish, artificial nails, or any other type of covering on natural nails (fingers and toes) ?If you have artificial nails or gel coating that need to be removed by a nail salon, please have this removed prior to surgery. Artificial nails or gel coating may interfere with anesthesia's ability to adequately monitor your vital signs. ? ?[facility] is not responsible for any belongings or valuables. .  ? ?Do NOT Smoke (Tobacco/Vaping)  24 hours prior to your procedure ? ?If you use a CPAP at night, you may bring your mask for your overnight stay. ?  ?Contacts, glasses, hearing aids, dentures or partials may not be worn into surgery, please bring cases for these belongings ?  ?For patients admitted to the hospital, discharge time will be determined by your treatment team. ?  ?Patients discharged the day of surgery will not be allowed to drive home, and someone needs to stay with them for 24 hours. ? ? ?SURGICAL WAITING ROOM VISITATION ?Patients having  surgery or a procedure in a hospital may have two support people. ?Children under the age of 46 must have an adult with them who is not the patient. ?They may stay in the waiting area during the procedure and may switch out with other visitors. If the patient needs to stay at the hospital during part of their recovery, the visitor guidelines for inpatient rooms apply. ? ?Please refer to the [facility] website for the visitor guidelines for Inpatients (after your surgery is over and you are in a regular room).  ? ? ? ? ? ?Special instructions:   ? ?Oral Hygiene is also important to reduce your risk of infection.  Remember - BRUSH YOUR TEETH THE MORNING OF SURGERY WITH YOUR REGULAR TOOTHPASTE ? ? ?[facility]- Preparing For Surgery ? ?Before surgery, you can play an important role. Because skin is not sterile, your skin needs to be as free of germs as possible. You can reduce the number of germs on your skin by washing with CHG (chlorahexidine gluconate) Soap before surgery.  CHG is an antiseptic cleaner which kills germs and bonds with the skin to continue killing germs even after washing.   ? ? ?Please do not use if you have an allergy to CHG or antibacterial soaps. If your skin becomes reddened/irritated stop using the CHG.  ?Do not shave (including legs and underarms) for at least 48 hours prior to first CHG shower. It is OK to shave your  face. ? ?Please follow these instructions carefully. ?  ? ? Shower the NIGHT BEFORE SURGERY and the MORNING OF SURGERY with CHG Soap.  ? If you chose to wash your hair, wash your hair first as usual with your normal shampoo. After you shampoo, rinse your hair and body thoroughly to remove the shampoo.  Then [employer] and genitals (private parts) with your normal soap and rinse thoroughly to remove soap. ? ?After that Use CHG Soap as you would any other liquid soap. You can apply CHG directly to the skin and wash gently with a scrungie or a clean washcloth.  ? ?Apply the CHG Soap  to your body ONLY FROM THE NECK DOWN.  Do not use on open wounds or open sores. Avoid contact with your eyes, ears, mouth and genitals (private parts). Wash Face and genitals (private parts)  with your normal soap.  ? ?Wash thoroughly, paying special attention to the area where your surgery will be performed. ? ?Thoroughly rinse your body with warm water from the neck down. ? ?DO NOT shower/wash with your normal soap after using and rinsing off the CHG Soap. ? ?Pat yourself dry with a CLEAN TOWEL. ? ?Wear CLEAN PAJAMAS to bed the night before surgery ? ?Place CLEAN SHEETS on your bed the night before your surgery ? ?DO NOT SLEEP WITH PETS. ? ? ?Day of Surgery: ? ?Take a shower with CHG soap. ?Wear Clean/Comfortable clothing the morning of surgery ?Do not apply any deodorants/lotions.   ?Remember to brush your teeth WITH YOUR REGULAR TOOTHPASTE. ? ? ? ?If you received a COVID test during your pre-op visit, it is requested that you wear a mask when out in public, stay away from anyone that may not be feeling well, and notify your surgeon if you develop symptoms. If you have been in contact with anyone that has tested positive in the last 10 days, please notify your surgeon. ? ?  ?Please read over the following fact sheets that you were given.   ?

## 2021-07-25 ENCOUNTER — Encounter (HOSPITAL_COMMUNITY)
Admission: RE | Admit: 2021-07-25 | Discharge: 2021-07-25 | Disposition: A | Discharge status: Home / Self Care | Payer: Medicare HMO | Source: Ambulatory Visit | Attending: Surgery | Admitting: Surgery

## 2021-07-25 ENCOUNTER — Other Ambulatory Visit: Payer: Self-pay

## 2021-07-25 ENCOUNTER — Encounter (HOSPITAL_COMMUNITY): Payer: Self-pay

## 2021-07-25 VITALS — BP 150/98 | HR 66 | Temp 98.2°F | Resp 17 | Ht 70.0 in | Wt 228.9 lb

## 2021-07-25 DIAGNOSIS — I10 Essential (primary) hypertension: Secondary | ICD-10-CM | POA: Diagnosis not present

## 2021-07-25 DIAGNOSIS — I7143 Infrarenal abdominal aortic aneurysm, without rupture: Secondary | ICD-10-CM | POA: Insufficient documentation

## 2021-07-25 DIAGNOSIS — Z01812 Encounter for preprocedural laboratory examination: Secondary | ICD-10-CM | POA: Diagnosis not present

## 2021-07-25 DIAGNOSIS — Z01818 Encounter for other preprocedural examination: Secondary | ICD-10-CM

## 2021-07-25 LAB — CBC
HCT: 45.9 % (ref 39.0–52.0)
Hemoglobin: 15 g/dL (ref 13.0–17.0)
MCH: 29.1 pg (ref 26.0–34.0)
MCHC: 32.7 g/dL (ref 30.0–36.0)
MCV: 89.1 fL (ref 80.0–100.0)
Platelets: 193 10*3/uL (ref 150–400)
RBC: 5.15 MIL/uL (ref 4.22–5.81)
RDW: 13.8 % (ref 11.5–15.5)
WBC: 6.1 10*3/uL (ref 4.0–10.5)
nRBC: 0 % (ref 0.0–0.2)

## 2021-07-25 LAB — TYPE AND SCREEN
ABO/RH(D): A POS
Antibody Screen: NEGATIVE

## 2021-07-25 LAB — COMPREHENSIVE METABOLIC PANEL
ALT: 20 U/L (ref 0–44)
AST: 17 U/L (ref 15–41)
Albumin: 4.1 g/dL (ref 3.5–5.0)
Alkaline Phosphatase: 86 U/L (ref 38–126)
Anion gap: 6 (ref 5–15)
BUN: 15 mg/dL (ref 8–23)
CO2: 30 mmol/L (ref 22–32)
Calcium: 9.6 mg/dL (ref 8.9–10.3)
Chloride: 104 mmol/L (ref 98–111)
Creatinine, Ser: 1.36 mg/dL — ABNORMAL HIGH (ref 0.61–1.24)
GFR, Estimated: 55 mL/min — ABNORMAL LOW (ref >60)
Glucose, Bld: 140 mg/dL — ABNORMAL HIGH (ref 70–99)
Potassium: 4.6 mmol/L (ref 3.5–5.1)
Sodium: 140 mmol/L (ref 135–145)
Total Bilirubin: 0.9 mg/dL (ref 0.3–1.2)
Total Protein: 6.6 g/dL (ref 6.5–8.1)

## 2021-07-25 LAB — URINALYSIS, ROUTINE W REFLEX MICROSCOPIC
Bilirubin Urine: NEGATIVE
Glucose, UA: NEGATIVE mg/dL
Hgb urine dipstick: NEGATIVE
Ketones, ur: NEGATIVE mg/dL
Leukocytes,Ua: NEGATIVE
Nitrite: NEGATIVE
Protein, ur: NEGATIVE mg/dL
Specific Gravity, Urine: 1.02 (ref 1.005–1.030)
pH: 5 (ref 5.0–8.0)

## 2021-07-25 LAB — PROTIME-INR
INR: 1 (ref 0.8–1.2)
Prothrombin Time: 13.4 seconds (ref 11.4–15.2)

## 2021-07-25 LAB — SURGICAL PCR SCREEN
MRSA, PCR: NEGATIVE
Staphylococcus aureus: NEGATIVE

## 2021-07-25 LAB — APTT: aPTT: 26 seconds (ref 24–36)

## 2021-07-25 NOTE — Progress Notes (Signed)
PCP:  Kathyrn Lass, MD ?Cardiologist:  Lenna Sciara, MD ? ?EKG:  06/25/21 ?CXR:  na ?ECHO:  07/16/21 ?Stress Test:  07/16/21 ?Cardiac Cath:  denies ? ?Fasting Blood Sugar-  na ?Checks Blood Sugar__na_ times a day ? ?OSA/CPAP: No ? ?ASA: Continue ?Blood Thinner: No ? ?Covid test not needed ? ?Anesthesia Review: Yes, recent cardiac workup, 07/16/21 ? ?Patient denies shortness of breath, fever, cough, and chest pain at PAT appointment. ? ?Patient verbalized understanding of instructions provided today at the PAT appointment.  Patient asked to review instructions at home and day of surgery.   ?

## 2021-07-28 NOTE — Anesthesia Preprocedure Evaluation (Addendum)
Anesthesia Evaluation  ?Patient identified by MRN, date of birth, ID band ?Patient awake ? ? ? ?Reviewed: ?Allergy & Precautions, NPO status , Patient's Chart, lab work & pertinent test results ? ?Airway ?Mallampati: III ? ?TM Distance: >3 FB ?Neck ROM: Full ? ? ? Dental ?no notable dental hx. ? ?  ?Pulmonary ?former smoker,  ?  ?Pulmonary exam normal ? ? ? ? ? ? ? Cardiovascular ?hypertension, Pt. on medications ?Normal cardiovascular exam ? ?Findings are consistent with no ischemia. The study is low risk. ?No ST deviation was noted. ?LV perfusion is normal. There is no evidence of ischemia. There is no evidence of infarction. ?Left ventricular function is abnormal. Global function is mildly reduced. Nuclear stress EF: 48 %. The left ventricular ejection fraction is mildly decreased (45-54%). End diastolic cavity size is normal. End systolic cavity size is normal. ?Prior study not available for comparison. ?No evidence of ischemia. Visually EF appears normal but calculates to mildly reduced. No focal wall motion abnormalities ? ?  ?Neuro/Psych ?negative neurological ROS ? negative psych ROS  ? GI/Hepatic ?negative GI ROS, Neg liver ROS,   ?Endo/Other  ?negative endocrine ROS ? Renal/GU ?negative Renal ROS  ? ?  ?Musculoskeletal ? ?(+) Arthritis ,  ? Abdominal ?(+) + obese,   ?Peds ? Hematology ?negative hematology ROS ?(+)   ?Anesthesia Other Findings ?Infrarenal abdominal aortic aneurysm without rupture ? Reproductive/Obstetrics ? ?  ? ? ? ? ? ? ? ? ? ? ? ? ? ?  ?  ? ? ? ? ? ? ?Anesthesia Physical ?Anesthesia Plan ? ?ASA: 3 ? ?Anesthesia Plan: General  ? ?Post-op Pain Management:   ? ?Induction: Intravenous ? ?PONV Risk Score and Plan: 2 and Ondansetron, Dexamethasone, Treatment may vary due to age or medical condition and Midazolam ? ?Airway Management Planned: Oral ETT ? ?Additional Equipment: Arterial line ? ?Intra-op Plan:  ? ?Post-operative Plan: Extubation in OR ? ?Informed  Consent: I have reviewed the patients History and Physical, chart, labs and discussed the procedure including the risks, benefits and alternatives for the proposed anesthesia with the patient or authorized representative who has indicated his/her understanding and acceptance.  ? ? ? ?Dental advisory given ? ?Plan Discussed with: CRNA ? ?Anesthesia Plan Comments: (PIV x 2 ? ?APP note by Durel Salts, FNP )  ? ? ? ? ?Anesthesia Quick Evaluation ? ?

## 2021-07-28 NOTE — Progress Notes (Signed)
Anesthesia Chart Review: ? ? Case: [id number] Date/Time: 07/30/21 0815  ? Procedure: ABDOMINAL AORTIC ENDOVASCULAR STENT GRAFT (GORE)  ? Anesthesia type: General  ? Pre-op diagnosis: Infrarenal abdominal aortic aneurysm without rupture  ? Location: [facility] ROOM 16 / [facility]  ? Surgeons: Serafina Mitchell, MD  ? ?  ? ? ?DISCUSSION: ?Pt is 76 years old with hx HTN, AAA ? ?VS: BP (!) 150/98 Comment: manual  Pulse 66   Temp 36.8 ?C (Oral)   Resp 17   Ht '5\' 10"'$  (1.778 m)   Wt 103.8 kg   SpO2 100%   BMI 32.84 kg/m?  ? ? ?PROVIDERS: ?- PCP is Kathyrn Lass, MD ?- Saw Lenna Sciara, MD with cardiology for pre-op eval on 06/25/21. Cleared for surgery in comment on stress test.  ? ? ?LABS: Labs reviewed: Acceptable for surgery. ?(all labs ordered are listed, but only abnormal results are displayed) ? ?Labs Reviewed  ?COMPREHENSIVE METABOLIC PANEL - Abnormal; Notable for the following components:  ?    Result Value  ? Glucose, Bld 140 (*)   ? Creatinine, Ser 1.36 (*)   ? GFR, Estimated 55 (*)   ? All other components within normal limits  ?SURGICAL PCR SCREEN  ?APTT  ?CBC  ?PROTIME-INR  ?URINALYSIS, ROUTINE W REFLEX MICROSCOPIC  ?TYPE AND SCREEN  ? ? ? ?IMAGES: ?CTA chest/abd/pelvis 06/13/21:  ?1. 5.6 cm infrarenal abdominal aortic aneurysm, previously 5.1 cm. ?2. 5.4 cm nonspecific right hepatic lesion, previously 3.6 cm in 2005. 2 smaller segment 2 lesions are also noted. Dedicated outpatient liver MR with contrast may be useful for further characterization. ?3. Coronary and Aortic Atherosclerosis (ICD10-170.0). ?4. Sigmoid diverticulosis ? ? ?EKG 06/25/21: SR with incomplete RBBB ? ? ?CV: ?Nuclear stress test 07/16/21:  ?  Findings are consistent with no ischemia. The study is low risk. ?  No ST deviation was noted. ?  LV perfusion is normal. There is no evidence of ischemia. There is no evidence of infarction. ?  Left ventricular function is abnormal. Global function is mildly reduced. Nuclear stress EF: 48 %. The left  ventricular ejection fraction is mildly decreased (45-54%). End diastolic cavity size is normal. End systolic cavity size is normal. ?  Prior study not available for comparison. ?  ? ?Echo 07/16/21:  ?1. Left ventricular ejection fraction, by estimation, is 60 to 65%. Left ventricular ejection fraction by 3D volume is 61 %. The left ventricle has normal function. The left ventricle has no regional wall motion abnormalities. There is mild left ventricular hypertrophy. Left ventricular diastolic parameters are consistent with Grade I diastolic dysfunction (impaired relaxation). The average left ventricular global longitudinal strain is -17.6 %. The global longitudinal strain is mildly abnormal.  ?2. Right ventricular systolic function is normal. The right ventricular size is normal. Tricuspid regurgitation signal is inadequate for assessing PA pressure.  ?3. The mitral valve is grossly normal. Trivial mitral valve regurgitation.  ?4. The aortic valve is tricuspid. Aortic valve regurgitation is not visualized.  ?5. The inferior vena cava is normal in size with <50% respiratory variability, suggesting right atrial pressure of 8 mmHg.  ? ?Carotid duplex 06/24/21:  ?- Right Carotid: Velocities in the right ICA are consistent with a 1-39% stenosis.  ?- Left Carotid: Velocities in the left ICA are consistent with a 1-39% stenosis.  ?- Vertebrals:  Bilateral vertebral arteries demonstrate antegrade flow.  ?- Subclavians: Normal flow hemodynamics were seen in bilateral subclavian arteries.  ? ? ?Past Medical History:  ?  Diagnosis Date  ? AAA (abdominal aortic aneurysm) ([place])   ? Arthritis   ? GERD (gastroesophageal reflux disease)   ? occasionally  ? Hyperlipemia   ? Hypertension   ? Kidney stones   ? Prostate cancer ([place])   ? ? ?Past Surgical History:  ?Procedure Laterality Date  ? CHOLECYSTECTOMY    ? fatty tumors    ? begein,numerous  ? KNEE SURGERY    ? cartledge removed  ? TOTAL KNEE ARTHROPLASTY  11/24/2011  ? left knee  ?  TOTAL KNEE ARTHROPLASTY  11/24/2011  ? Procedure: TOTAL KNEE ARTHROPLASTY;  Surgeon: Hessie Dibble, MD;  Location: [place];  Service: Orthopedics;  Laterality: Left;  with revision tibia  ? ? ?MEDICATIONS: ? amoxicillin (AMOXIL) 500 MG capsule  ? aspirin EC 81 MG tablet  ? calcium-vitamin D (OSCAL WITH D) 500-200 MG-UNIT tablet  ? losartan (COZAAR) 50 MG tablet  ? simvastatin (ZOCOR) 20 MG tablet  ? ?No current facility-administered medications for this encounter.  ? ? ?If no changes, I anticipate pt can proceed with surgery as scheduled.  ? ?Willeen Cass, PhD, FNP-BC ?[facility]/Anesthesiology ?Phone: [phone] ?07/28/2021 8:40 AM  ? ? ? ? ? ? ? ?

## 2021-07-30 ENCOUNTER — Encounter (HOSPITAL_COMMUNITY): Payer: Self-pay | Admitting: Surgery

## 2021-07-30 ENCOUNTER — Other Ambulatory Visit: Payer: Self-pay

## 2021-07-30 ENCOUNTER — Encounter (HOSPITAL_COMMUNITY)
Admission: RE | Disposition: A | Discharge status: Home / Self Care | Payer: Self-pay | Source: Home / Self Care | Attending: Surgery

## 2021-07-30 ENCOUNTER — Inpatient Hospital Stay (HOSPITAL_COMMUNITY): Payer: Medicare HMO

## 2021-07-30 ENCOUNTER — Inpatient Hospital Stay (HOSPITAL_COMMUNITY): Payer: Medicare HMO | Admitting: Emergency Medicine

## 2021-07-30 ENCOUNTER — Inpatient Hospital Stay (HOSPITAL_COMMUNITY)
Admission: RE | Admit: 2021-07-30 | Discharge: 2021-07-31 | DRG: 269 | Disposition: A | Discharge status: Home / Self Care | Payer: Medicare HMO | Attending: Surgery | Admitting: Surgery

## 2021-07-30 ENCOUNTER — Inpatient Hospital Stay (HOSPITAL_COMMUNITY): Payer: Medicare HMO | Admitting: Anesthesiology

## 2021-07-30 DIAGNOSIS — Z833 Family history of diabetes mellitus: Secondary | ICD-10-CM

## 2021-07-30 DIAGNOSIS — Z87442 Personal history of urinary calculi: Secondary | ICD-10-CM

## 2021-07-30 DIAGNOSIS — K219 Gastro-esophageal reflux disease without esophagitis: Secondary | ICD-10-CM | POA: Diagnosis present

## 2021-07-30 DIAGNOSIS — I714 Abdominal aortic aneurysm, without rupture, unspecified: Secondary | ICD-10-CM

## 2021-07-30 DIAGNOSIS — E785 Hyperlipidemia, unspecified: Secondary | ICD-10-CM | POA: Diagnosis present

## 2021-07-30 DIAGNOSIS — I10 Essential (primary) hypertension: Secondary | ICD-10-CM

## 2021-07-30 DIAGNOSIS — Z8546 Personal history of malignant neoplasm of prostate: Secondary | ICD-10-CM | POA: Diagnosis not present

## 2021-07-30 DIAGNOSIS — M1990 Unspecified osteoarthritis, unspecified site: Secondary | ICD-10-CM | POA: Diagnosis not present

## 2021-07-30 DIAGNOSIS — Z79899 Other long term (current) drug therapy: Secondary | ICD-10-CM

## 2021-07-30 DIAGNOSIS — I7143 Infrarenal abdominal aortic aneurysm, without rupture: Principal | ICD-10-CM | POA: Diagnosis present

## 2021-07-30 DIAGNOSIS — Z87891 Personal history of nicotine dependence: Secondary | ICD-10-CM | POA: Diagnosis not present

## 2021-07-30 DIAGNOSIS — Z923 Personal history of irradiation: Secondary | ICD-10-CM

## 2021-07-30 DIAGNOSIS — E7800 Pure hypercholesterolemia, unspecified: Secondary | ICD-10-CM | POA: Diagnosis not present

## 2021-07-30 DIAGNOSIS — E669 Obesity, unspecified: Secondary | ICD-10-CM | POA: Diagnosis not present

## 2021-07-30 DIAGNOSIS — I719 Aortic aneurysm of unspecified site, without rupture: Secondary | ICD-10-CM | POA: Diagnosis not present

## 2021-07-30 HISTORY — PX: ULTRASOUND GUIDANCE FOR VASCULAR ACCESS: SHX6516

## 2021-07-30 HISTORY — PX: ABDOMINAL AORTIC ENDOVASCULAR STENT GRAFT: SHX5707

## 2021-07-30 LAB — BASIC METABOLIC PANEL
Anion gap: 10 (ref 5–15)
BUN: 14 mg/dL (ref 8–23)
CO2: 23 mmol/L (ref 22–32)
Calcium: 8.7 mg/dL — ABNORMAL LOW (ref 8.9–10.3)
Chloride: 105 mmol/L (ref 98–111)
Creatinine, Ser: 1.21 mg/dL (ref 0.61–1.24)
GFR, Estimated: >60 mL/min (ref >60)
Glucose, Bld: 192 mg/dL — ABNORMAL HIGH (ref 70–99)
Potassium: 3.9 mmol/L (ref 3.5–5.1)
Sodium: 138 mmol/L (ref 135–145)

## 2021-07-30 LAB — CBC
HCT: 43.7 % (ref 39.0–52.0)
Hemoglobin: 14.7 g/dL (ref 13.0–17.0)
MCH: 29.4 pg (ref 26.0–34.0)
MCHC: 33.6 g/dL (ref 30.0–36.0)
MCV: 87.4 fL (ref 80.0–100.0)
Platelets: 173 10*3/uL (ref 150–400)
RBC: 5 MIL/uL (ref 4.22–5.81)
RDW: 13.8 % (ref 11.5–15.5)
WBC: 7.2 10*3/uL (ref 4.0–10.5)
nRBC: 0 % (ref 0.0–0.2)

## 2021-07-30 LAB — POCT ACTIVATED CLOTTING TIME: Activated Clotting Time: 251 seconds

## 2021-07-30 LAB — MAGNESIUM: Magnesium: 2.2 mg/dL (ref 1.7–2.4)

## 2021-07-30 LAB — PROTIME-INR
INR: 1 (ref 0.8–1.2)
Prothrombin Time: 13.3 seconds (ref 11.4–15.2)

## 2021-07-30 LAB — APTT: aPTT: 28 seconds (ref 24–36)

## 2021-07-30 SURGERY — INSERTION, ENDOVASCULAR STENT GRAFT, AORTA, ABDOMINAL
Anesthesia: General | Site: Groin

## 2021-07-30 MED ORDER — PROPOFOL 10 MG/ML IV BOLUS
INTRAVENOUS | Status: DC | PRN
  Administered 2021-07-30: 150 mg via INTRAVENOUS

## 2021-07-30 MED ORDER — ROCURONIUM BROMIDE 10 MG/ML (PF) SYRINGE
PREFILLED_SYRINGE | INTRAVENOUS | Status: DC | PRN
  Administered 2021-07-30: 40 mg via INTRAVENOUS
  Administered 2021-07-30: 60 mg via INTRAVENOUS

## 2021-07-30 MED ORDER — CHLORHEXIDINE GLUCONATE CLOTH 2 % EX PADS
6.0000 | MEDICATED_PAD | Freq: Once | CUTANEOUS | Status: DC
  Administered 2021-07-30: 6 via TOPICAL

## 2021-07-30 MED ORDER — ACETAMINOPHEN 325 MG PO TABS: 325.0000 mg | ORAL_TABLET | ORAL | Status: DC | PRN

## 2021-07-30 MED ORDER — OXYCODONE-ACETAMINOPHEN 5-325 MG PO TABS: 1.0000 | ORAL_TABLET | ORAL | Status: DC | PRN

## 2021-07-30 MED ORDER — PROTAMINE SULFATE 10 MG/ML IV SOLN
INTRAVENOUS | Status: DC | PRN
  Administered 2021-07-30: 50 mg via INTRAVENOUS

## 2021-07-30 MED ORDER — BISACODYL 5 MG PO TBEC: 5.0000 mg | DELAYED_RELEASE_TABLET | Freq: Every day | ORAL | Status: DC | PRN

## 2021-07-30 MED ORDER — SODIUM CHLORIDE 0.9 % IV SOLN: INTRAVENOUS | Status: DC

## 2021-07-30 MED ORDER — HYDRALAZINE HCL 20 MG/ML IJ SOLN
INTRAMUSCULAR | Status: AC
Start: 2021-07-30 — End: 2021-07-30
  Filled 2021-07-30: qty 1

## 2021-07-30 MED ORDER — OXYCODONE HCL 5 MG PO TABS: 5.0000 mg | ORAL_TABLET | Freq: Once | ORAL | Status: DC | PRN

## 2021-07-30 MED ORDER — HYDROMORPHONE HCL 1 MG/ML IJ SOLN: 0.5000 mg | INTRAMUSCULAR | Status: DC | PRN

## 2021-07-30 MED ORDER — ONDANSETRON HCL 4 MG/2ML IJ SOLN
INTRAMUSCULAR | Status: DC | PRN
  Administered 2021-07-30: 4 mg via INTRAVENOUS

## 2021-07-30 MED ORDER — LABETALOL HCL 5 MG/ML IV SOLN
INTRAVENOUS | Status: AC
  Filled 2021-07-30: qty 4

## 2021-07-30 MED ORDER — LABETALOL HCL 5 MG/ML IV SOLN: 10.0000 mg | INTRAVENOUS | Status: DC | PRN

## 2021-07-30 MED ORDER — HEPARIN 6000 UNIT IRRIGATION SOLUTION
Status: AC
  Filled 2021-07-30: qty 500

## 2021-07-30 MED ORDER — PROPOFOL 10 MG/ML IV BOLUS
INTRAVENOUS | Status: AC
  Filled 2021-07-30: qty 20

## 2021-07-30 MED ORDER — IODIXANOL 320 MG/ML IV SOLN
INTRAVENOUS | Status: DC | PRN
  Administered 2021-07-30: 66 mL via INTRA_ARTERIAL

## 2021-07-30 MED ORDER — HYDRALAZINE HCL 20 MG/ML IJ SOLN: 5.0000 mg | INTRAMUSCULAR | Status: DC | PRN

## 2021-07-30 MED ORDER — MAGNESIUM SULFATE 2 GM/50ML IV SOLN: 2.0000 g | Freq: Every day | INTRAVENOUS | Status: DC | PRN

## 2021-07-30 MED ORDER — ACETAMINOPHEN 160 MG/5ML PO SOLN: 325.0000 mg | ORAL | Status: DC | PRN

## 2021-07-30 MED ORDER — CEFAZOLIN SODIUM-DEXTROSE 2-4 GM/100ML-% IV SOLN
2.0000 g | INTRAVENOUS | Status: AC
  Administered 2021-07-30: 2 g via INTRAVENOUS
  Filled 2021-07-30: qty 100

## 2021-07-30 MED ORDER — FENTANYL CITRATE (PF) 250 MCG/5ML IJ SOLN
INTRAMUSCULAR | Status: DC | PRN
  Administered 2021-07-30: 50 ug via INTRAVENOUS
  Administered 2021-07-30: 100 ug via INTRAVENOUS

## 2021-07-30 MED ORDER — HYDRALAZINE HCL 20 MG/ML IJ SOLN
INTRAMUSCULAR | Status: DC | PRN
  Administered 2021-07-30: 10 mg via INTRAVENOUS

## 2021-07-30 MED ORDER — ACETAMINOPHEN 500 MG PO TABS
1000.0000 mg | ORAL_TABLET | Freq: Once | ORAL | Status: AC
  Administered 2021-07-30: 1000 mg via ORAL
  Filled 2021-07-30: qty 2

## 2021-07-30 MED ORDER — HYDRALAZINE HCL 20 MG/ML IJ SOLN
INTRAMUSCULAR | Status: AC
  Filled 2021-07-30: qty 1

## 2021-07-30 MED ORDER — ONDANSETRON HCL 4 MG/2ML IJ SOLN: 4.0000 mg | Freq: Four times a day (QID) | INTRAMUSCULAR | Status: DC | PRN

## 2021-07-30 MED ORDER — LACTATED RINGERS IV SOLN: INTRAVENOUS | Status: DC | PRN

## 2021-07-30 MED ORDER — ROCURONIUM BROMIDE 10 MG/ML (PF) SYRINGE
PREFILLED_SYRINGE | INTRAVENOUS | Status: AC
  Filled 2021-07-30: qty 10

## 2021-07-30 MED ORDER — LIDOCAINE 2% (20 MG/ML) 5 ML SYRINGE
INTRAMUSCULAR | Status: AC
  Filled 2021-07-30: qty 5

## 2021-07-30 MED ORDER — AMISULPRIDE (ANTIEMETIC) 5 MG/2ML IV SOLN: 10.0000 mg | Freq: Once | INTRAVENOUS | Status: DC | PRN

## 2021-07-30 MED ORDER — ONDANSETRON HCL 4 MG/2ML IJ SOLN
INTRAMUSCULAR | Status: AC
  Filled 2021-07-30: qty 2

## 2021-07-30 MED ORDER — HEPARIN SODIUM (PORCINE) 5000 UNIT/ML IJ SOLN
5000.0000 [IU] | Freq: Three times a day (TID) | INTRAMUSCULAR | Status: DC
  Administered 2021-07-30 – 2021-07-31 (×3): 5000 [IU] via SUBCUTANEOUS
  Filled 2021-07-30 (×3): qty 1

## 2021-07-30 MED ORDER — ORAL CARE MOUTH RINSE: 15.0000 mL | Freq: Once | OROMUCOSAL | Status: AC

## 2021-07-30 MED ORDER — LOSARTAN POTASSIUM 50 MG PO TABS
100.0000 mg | ORAL_TABLET | Freq: Every morning | ORAL | Status: DC
  Administered 2021-07-31: 100 mg via ORAL
  Filled 2021-07-30: qty 2

## 2021-07-30 MED ORDER — PHENYLEPHRINE HCL-NACL 20-0.9 MG/250ML-% IV SOLN
INTRAVENOUS | Status: DC | PRN
Start: 2021-07-30 — End: 2021-07-30
  Administered 2021-07-30: 25 ug/min via INTRAVENOUS

## 2021-07-30 MED ORDER — ACETAMINOPHEN 650 MG RE SUPP: 325.0000 mg | RECTAL | Status: DC | PRN

## 2021-07-30 MED ORDER — DEXAMETHASONE SODIUM PHOSPHATE 10 MG/ML IJ SOLN
INTRAMUSCULAR | Status: AC
  Filled 2021-07-30: qty 1

## 2021-07-30 MED ORDER — GUAIFENESIN-DM 100-10 MG/5ML PO SYRP: 15.0000 mL | ORAL_SOLUTION | ORAL | Status: DC | PRN

## 2021-07-30 MED ORDER — SENNOSIDES-DOCUSATE SODIUM 8.6-50 MG PO TABS: 1.0000 | ORAL_TABLET | Freq: Every evening | ORAL | Status: DC | PRN

## 2021-07-30 MED ORDER — LABETALOL HCL 5 MG/ML IV SOLN
5.0000 mg | INTRAVENOUS | Status: AC | PRN
  Administered 2021-07-30 (×2): 5 mg via INTRAVENOUS

## 2021-07-30 MED ORDER — LIDOCAINE 2% (20 MG/ML) 5 ML SYRINGE
INTRAMUSCULAR | Status: DC | PRN
Start: 2021-07-30 — End: 2021-07-30
  Administered 2021-07-30: 60 mg via INTRAVENOUS
  Administered 2021-07-30: 40 mg via INTRAVENOUS

## 2021-07-30 MED ORDER — SUGAMMADEX SODIUM 200 MG/2ML IV SOLN
INTRAVENOUS | Status: DC | PRN
  Administered 2021-07-30: 400 mg via INTRAVENOUS

## 2021-07-30 MED ORDER — ASPIRIN EC 81 MG PO TBEC
81.0000 mg | DELAYED_RELEASE_TABLET | Freq: Every day | ORAL | Status: DC
  Administered 2021-07-30 – 2021-07-31 (×2): 81 mg via ORAL
  Filled 2021-07-30 (×2): qty 1

## 2021-07-30 MED ORDER — OXYCODONE HCL 5 MG/5ML PO SOLN: 5.0000 mg | Freq: Once | ORAL | Status: DC | PRN

## 2021-07-30 MED ORDER — FENTANYL CITRATE (PF) 100 MCG/2ML IJ SOLN: 25.0000 ug | INTRAMUSCULAR | Status: DC | PRN

## 2021-07-30 MED ORDER — FENTANYL CITRATE (PF) 250 MCG/5ML IJ SOLN
INTRAMUSCULAR | Status: AC
  Filled 2021-07-30: qty 5

## 2021-07-30 MED ORDER — HEPARIN 6000 UNIT IRRIGATION SOLUTION
Status: DC | PRN
  Administered 2021-07-30: 1

## 2021-07-30 MED ORDER — SIMVASTATIN 20 MG PO TABS
10.0000 mg | ORAL_TABLET | Freq: Every morning | ORAL | Status: DC
  Administered 2021-07-31: 10 mg via ORAL
  Filled 2021-07-30: qty 1

## 2021-07-30 MED ORDER — LACTATED RINGERS IV SOLN: INTRAVENOUS | Status: DC

## 2021-07-30 MED ORDER — SODIUM CHLORIDE 0.9 % IV SOLN: 500.0000 mL | Freq: Once | INTRAVENOUS | Status: DC | PRN

## 2021-07-30 MED ORDER — POTASSIUM CHLORIDE CRYS ER 20 MEQ PO TBCR: 20.0000 meq | EXTENDED_RELEASE_TABLET | Freq: Every day | ORAL | Status: DC | PRN

## 2021-07-30 MED ORDER — HYDRALAZINE HCL 20 MG/ML IJ SOLN
5.0000 mg | INTRAMUSCULAR | Status: DC | PRN
  Administered 2021-07-30: 5 mg via INTRAVENOUS

## 2021-07-30 MED ORDER — PHENOL 1.4 % MT LIQD: 1.0000 | OROMUCOSAL | Status: DC | PRN

## 2021-07-30 MED ORDER — DEXAMETHASONE SODIUM PHOSPHATE 10 MG/ML IJ SOLN
INTRAMUSCULAR | Status: DC | PRN
  Administered 2021-07-30: 5 mg via INTRAVENOUS

## 2021-07-30 MED ORDER — CEFAZOLIN SODIUM-DEXTROSE 2-4 GM/100ML-% IV SOLN
2.0000 g | Freq: Three times a day (TID) | INTRAVENOUS | Status: AC
  Administered 2021-07-30 – 2021-07-31 (×2): 2 g via INTRAVENOUS
  Filled 2021-07-30 (×2): qty 100

## 2021-07-30 MED ORDER — ALUM & MAG HYDROXIDE-SIMETH 200-200-20 MG/5ML PO SUSP: 15.0000 mL | ORAL | Status: DC | PRN

## 2021-07-30 MED ORDER — PANTOPRAZOLE SODIUM 40 MG PO TBEC
40.0000 mg | DELAYED_RELEASE_TABLET | Freq: Every day | ORAL | Status: DC
  Administered 2021-07-30 – 2021-07-31 (×2): 40 mg via ORAL
  Filled 2021-07-30 (×2): qty 1

## 2021-07-30 MED ORDER — HEPARIN SODIUM (PORCINE) 1000 UNIT/ML IJ SOLN
INTRAMUSCULAR | Status: DC | PRN
Start: 2021-07-30 — End: 2021-07-30
  Administered 2021-07-30: 10000 [IU] via INTRAVENOUS

## 2021-07-30 MED ORDER — DOCUSATE SODIUM 100 MG PO CAPS
100.0000 mg | ORAL_CAPSULE | Freq: Every day | ORAL | Status: DC
  Filled 2021-07-30: qty 1

## 2021-07-30 MED ORDER — ACETAMINOPHEN 10 MG/ML IV SOLN: 1000.0000 mg | Freq: Once | INTRAVENOUS | Status: DC | PRN

## 2021-07-30 MED ORDER — CHLORHEXIDINE GLUCONATE 0.12 % MT SOLN
15.0000 mL | Freq: Once | OROMUCOSAL | Status: AC
  Administered 2021-07-30: 15 mL via OROMUCOSAL
  Filled 2021-07-30: qty 15

## 2021-07-30 MED ORDER — METOPROLOL TARTRATE 5 MG/5ML IV SOLN: 2.0000 mg | INTRAVENOUS | Status: DC | PRN

## 2021-07-30 SURGICAL SUPPLY — 58 items
ADH SKN CLS APL DERMABOND .7 (GAUZE/BANDAGES/DRESSINGS) ×4
BAG COUNTER SPONGE SURGICOUNT (BAG) ×3 IMPLANT
BAG SPNG CNTER NS LX DISP (BAG) ×2
BLADE CLIPPER SURG (BLADE) ×3 IMPLANT
CANISTER SUCT 3000ML PPV (MISCELLANEOUS) ×3 IMPLANT
CATH BEACON 5.038 65CM KMP-01 (CATHETERS) ×3 IMPLANT
CATH OMNI FLUSH .035X70CM (CATHETERS) ×3 IMPLANT
COVER SURGICAL LIGHT HANDLE (MISCELLANEOUS) ×1 IMPLANT
DERMABOND ADVANCED (GAUZE/BANDAGES/DRESSINGS) ×2
DERMABOND ADVANCED .7 DNX12 (GAUZE/BANDAGES/DRESSINGS) ×2 IMPLANT
DEVICE CLOSURE PERCLS PRGLD 6F (VASCULAR PRODUCTS) ×8 IMPLANT
DEVICE TORQUE H2O (MISCELLANEOUS) IMPLANT
DRSG TEGADERM 2-3/8X2-3/4 SM (GAUZE/BANDAGES/DRESSINGS) ×6 IMPLANT
DRYSEAL FLEXSHEATH 16FR 33CM (SHEATH) ×1
DRYSEAL FLEXSHEATH 18FR 33CM (SHEATH) ×1
ELECT CAUTERY BLADE 6.4 (BLADE) ×3 IMPLANT
ELECT REM PT RETURN 9FT ADLT (ELECTROSURGICAL) ×6
ELECTRODE REM PT RTRN 9FT ADLT (ELECTROSURGICAL) ×4 IMPLANT
EXCLDR TRNK 28.5X14.5X12 16F (Endovascular Graft) ×3 IMPLANT
EXCLUDER TNK 28.5X14.5X12 16F (Endovascular Graft) IMPLANT
GAUZE SPONGE 2X2 8PLY STRL LF (GAUZE/BANDAGES/DRESSINGS) ×4 IMPLANT
GLOVE SURG SS PI 7.5 STRL IVOR (GLOVE) ×9 IMPLANT
GOWN STRL REUS W/ TWL LRG LVL3 (GOWN DISPOSABLE) ×4 IMPLANT
GOWN STRL REUS W/ TWL XL LVL3 (GOWN DISPOSABLE) ×4 IMPLANT
GOWN STRL REUS W/TWL LRG LVL3 (GOWN DISPOSABLE) ×6
GOWN STRL REUS W/TWL XL LVL3 (GOWN DISPOSABLE) ×6
GRAFT BALLN CATH 65CM (STENTS) ×2 IMPLANT
GUIDEWIRE ANGLED .035X150CM (WIRE) IMPLANT
KIT BASIN OR (CUSTOM PROCEDURE TRAY) ×3 IMPLANT
KIT DRAIN CSF ACCUDRAIN (MISCELLANEOUS) IMPLANT
KIT TURNOVER KIT B (KITS) ×3 IMPLANT
LEG CONTRALATERAL 16X18X13.5 (Endovascular Graft) ×1 IMPLANT
LEG CONTRALATERAL 18X11.5 (Endovascular Graft) ×1 IMPLANT
NS IRRIG 1000ML POUR BTL (IV SOLUTION) ×3 IMPLANT
PACK ENDOVASCULAR (PACKS) ×3 IMPLANT
PAD ARMBOARD 7.5X6 YLW CONV (MISCELLANEOUS) ×6 IMPLANT
PENCIL BUTTON HOLSTER BLD 10FT (ELECTRODE) ×3 IMPLANT
PERCLOSE PROGLIDE 6F (VASCULAR PRODUCTS) ×15
SET MICROPUNCTURE 5F STIFF (MISCELLANEOUS) ×3 IMPLANT
SHEATH BRITE TIP 8FR 23CM (SHEATH) ×3 IMPLANT
SHEATH DRYSEAL FLEX 16FR 33CM (SHEATH) IMPLANT
SHEATH DRYSEAL FLEX 18FR 33CM (SHEATH) IMPLANT
SHEATH PINNACLE 8F 10CM (SHEATH) ×3 IMPLANT
SPONGE GAUZE 2X2 STER 10/PKG (GAUZE/BANDAGES/DRESSINGS) ×2
STENT GRAFT BALLN CATH 65CM (STENTS) ×3
STOPCOCK MORSE 400PSI 3WAY (MISCELLANEOUS) ×3 IMPLANT
SUT PROLENE 5 0 C 1 24 (SUTURE) IMPLANT
SUT VIC AB 2-0 CT1 27 (SUTURE)
SUT VIC AB 2-0 CT1 TAPERPNT 27 (SUTURE) IMPLANT
SUT VIC AB 3-0 SH 27 (SUTURE)
SUT VIC AB 3-0 SH 27X BRD (SUTURE) IMPLANT
SUT VICRYL 4-0 PS2 18IN ABS (SUTURE) IMPLANT
SYR 20ML LL LF (SYRINGE) ×3 IMPLANT
TOWEL GREEN STERILE (TOWEL DISPOSABLE) ×3 IMPLANT
TRAY FOLEY MTR SLVR 16FR STAT (SET/KITS/TRAYS/PACK) ×3 IMPLANT
TUBING HIGH PRESSURE 120CM (CONNECTOR) ×3 IMPLANT
WIRE AMPLATZ SS-J .035X180CM (WIRE) ×6 IMPLANT
WIRE BENTSON .035X145CM (WIRE) ×6 IMPLANT

## 2021-07-30 NOTE — Progress Notes (Signed)
? ?  S/P EVAR ?B groins soft without hematoma ?Moving B LE , warm and well perfused ?General no acute distress ? ?Stable post op disposition ? ?[name] ?PA-C ?[facility] [phone] ?

## 2021-07-30 NOTE — Transfer of Care (Signed)
Immediate Anesthesia Transfer of Care Note ? ?Patient: Scott Hill ? ?Procedure(s) Performed: ABDOMINAL AORTIC ENDOVASCULAR STENT GRAFT (GORE) ?ULTRASOUND GUIDANCE FOR VASCULAR ACCESS, BILATERAL FEMORAL ARTERIES (Bilateral: Groin) ? ?Patient Location: PACU ? ?Anesthesia Type:General ? ?Level of Consciousness: awake, alert , oriented and drowsy ? ?Airway & Oxygen Therapy: Patient Spontanous Breathing and Patient connected to face mask oxygen ? ?Post-op Assessment: Report given to RN, Post -op Vital signs reviewed and stable and Patient moving all extremities X 4 ? ?Post vital signs: Reviewed and stable ? ?Last Vitals:  ?Vitals Value Taken Time  ?BP 175/93 07/30/21 1026  ?Temp    ?Pulse 79 07/30/21 1028  ?Resp 16 07/30/21 1028  ?SpO2 94 % 07/30/21 1028  ?Vitals shown include unvalidated device data. ? ?Last Pain:  ?Vitals:  ? 07/30/21 0703  ?TempSrc:   ?PainSc: 0-No pain  ?   ? ?  ? ?Complications: No notable events documented. ?

## 2021-07-30 NOTE — Progress Notes (Signed)
Patient brought to patient from PACU. VSS. Telemetry wall monitor applied, CCMD notified. Patient oriented to room and staff. Call bell in reach. ? ?Daymon Larsen, RN  ?

## 2021-07-30 NOTE — Anesthesia Postprocedure Evaluation (Signed)
Anesthesia Post Note ? ?Patient: Scott Hill ? ?Procedure(s) Performed: ABDOMINAL AORTIC ENDOVASCULAR STENT GRAFT (GORE) ?ULTRASOUND GUIDANCE FOR VASCULAR ACCESS, BILATERAL FEMORAL ARTERIES (Bilateral: Groin) ? ?  ? ?Patient location during evaluation: PACU ?Anesthesia Type: General ?Level of consciousness: awake and alert ?Pain management: pain level controlled ?Vital Signs Assessment: post-procedure vital signs reviewed and stable ?Respiratory status: spontaneous breathing, nonlabored ventilation, respiratory function stable and patient connected to nasal cannula oxygen ?Cardiovascular status: blood pressure returned to baseline and stable ?Postop Assessment: no apparent nausea or vomiting ?Anesthetic complications: no ? ? ?No notable events documented. ? ?Last Vitals:  ?Vitals:  ? 07/30/21 1111 07/30/21 1131  ?BP: (!) 150/81 (!) 162/82  ?Pulse: 73 72  ?Resp: 13 14  ?Temp: 36.7 ?C 36.6 ?C  ?SpO2: 96% 98%  ?  ?Last Pain:  ?Vitals:  ? 07/30/21 1131  ?TempSrc: Oral  ?PainSc: 0-No pain  ? ? ?  ?  ?  ?  ?  ?  ? ?Effie Berkshire ? ? ? ? ?

## 2021-07-30 NOTE — Op Note (Signed)
? ? ?Patient name: Geordie Nooney MRN: [id number] DOB: 02-27-1946 Sex: male ? ?07/30/2021 ?Pre-operative Diagnosis: Abdominal Aortic Aneurysm ?Post-operative diagnosis:  Same ?Surgeon:  Annamarie Major ?Assistants:  Fortunato Curling, MD ?Procedure:   #1: Endovascular pair of abdominal aortic aneurysm ?  #2: Bilateral ultrasound-guided common femoral artery access ?  #3: Abdominal aortogram ?  #4: Catheter in aorta x2 ?Anesthesia:  General ?Blood Loss:  minimal ?Specimens:  none ? ?Findings:  complete exclusion ? ?Devices Used:  Main body GORE, primary right [id number], right extension 18x11.5, contra left 18x13.5 ? ?Indications: This is a 76 year old gentleman who we have been following for a aneurysm.  Most recent CT scan showed a 5.6 cm infrarenal aneurysm.  He comes in today for surgical repair. ? ?Procedure:  The patient was identified in the holding area and taken to [facility]  The patient was then placed supine on the table. general anesthesia was administered.  The patient was prepped and draped in the usual sterile fashion.  A time out was called and antibiotics were administered.  An assistant was necessary to expedite procedure and assist with technical details. ? ?Ultrasound was used to evaluate bilateral common femoral arteries which were widely patent and easily compressible.  A #11 blade was used to make a skin nick.  Bilateral common femoral arteries were then cannulated under ultrasound guidance with a micropuncture needle.  A [place] wire was advanced without resistance and a micropuncture sheath was placed.  Next, a Bentson wire was inserted.  The subcutaneous tracts were dilated with an 8 French dilator and Pro-glide devices were deployed at the 11:00 and 1 o'clock position for preclosure.  8 French sheaths were placed bilaterally.  The patient was fully heparinized.  Heparin levels were monitored with ACT measurements. ? ?Over a Meier wire, an 97 French sheath was advanced up the right side.  A pigtail  catheter was advanced by Dr. Carlis Abbott up the left side and positioned at L1.  The main body device was prepared on the back table.  This was a Gore 28 x 14 x 12 device.  An abdominal aortogram was then performed locating the renal arteries.  The main body device was then deployed landing below the renals.  Next, the contralateral gate was cannulated with an Omni Flush catheter and a Bentson wire.  The catheter was able to be freely rotated within the main body, confirming successful cannulation.  The image detector was rotated to a RAO position and a retrograde injection was performed through the sheath in the left groin locating the left hypogastric artery.  A Meier wire was then inserted by Dr. Carlis Abbott who then placed the 12 French sheath.  The left limb was then prepared on the back table and inserted.  This was an 18 x 13.5 device.  It was then successfully deployed landing just short of the hypogastric artery.  Next, the remaining portion of the ipsilateral limb was deployed and the delivery system was removed.  The image detector was rotated to a left anterior oblique position and a retrograde injection was performed through the sheath in the right groin locating the right hypogastric artery.  The ipsilateral extension was prepared on the back table and inserted and then deployed landing just short of the right hypogastric artery.  This was an 18 x 11.5 device.  Next, a millimeter of the balloon was used to mold the proximal and distal attachment sites as well as device overlap.  A completion arteriogram was then  performed which showed successful exclusion of the aneurysm with preserved patency of bilateral renal arteries and bilateral hypogastric arteries.  There is a late type II leak.  I was satisfied with these results.  Bentson wires were then inserted bilaterally.  The Pro-glide devices were then used to close the arteriotomy sites with Dr. Carlis Abbott providing hemostasis with manual compression.  The patient's  heparin was then reversed with 50 mg of protamine.  I checked to make sure there was good Doppler signals in the feet.  Cautery was used on the subcutaneous tissue followed by Dermabond.  The patient was successfully extubated taken recovery in stable condition.  There are no immediate complications. ? ? ?Disposition: To PACU stable. ? ? ?V. Annamarie Major, M.D., FACS ?[facility] [place] ?Office: [phone] ?Pager:  [phone]  ?

## 2021-07-30 NOTE — Anesthesia Procedure Notes (Signed)
Arterial Line Insertion ?Start/End5/05/2021 7:55 AM, 07/30/2021 8:00 AM ?Performed by: Rande Brunt, CRNA, CRNA ? Patient location: Pre-op. ?Preanesthetic checklist: patient identified, IV checked, site marked, risks and benefits discussed, surgical consent, monitors and equipment checked, pre-op evaluation, timeout performed and anesthesia consent ?Lidocaine 1% used for infiltration ?Right, radial was placed ?Catheter size: 20 G ?Hand hygiene performed  and maximum sterile barriers used  ? ?Attempts: 1 ?Procedure performed without using ultrasound guided technique. ?Following insertion, dressing applied and Biopatch. ?Post procedure assessment: normal and unchanged ? ?Patient tolerated the procedure well with no immediate complications. ? ? ?

## 2021-07-30 NOTE — Anesthesia Procedure Notes (Signed)
Procedure Name: Intubation ?Date/Time: 07/30/2021 8:50 AM ?Performed by: Rande Brunt, CRNA ?Pre-anesthesia Checklist: Patient identified, Emergency Drugs available, Suction available and Patient being monitored ?Patient Re-evaluated:Patient Re-evaluated prior to induction ?Oxygen Delivery Method: Circle System Utilized ?Preoxygenation: Pre-oxygenation with 100% oxygen ?Induction Type: IV induction and Cricoid Pressure applied ?Ventilation: Mask ventilation without difficulty ?Laryngoscope Size: Mac and 4 ?Grade View: Grade II ?Tube type: Oral ?Tube size: 7.5 mm ?Number of attempts: 1 ?Airway Equipment and Method: Stylet and Oral airway ?Placement Confirmation: ETT inserted through vocal cords under direct vision, positive ETCO2 and breath sounds checked- equal and bilateral ?Secured at: 22 cm ?Tube secured with: Tape ?Dental Injury: Teeth and Oropharynx as per pre-operative assessment  ? ? ? ? ?

## 2021-07-30 NOTE — H&P (Signed)
?  ?[facility] [place] ?  ?Patient name: Scott Hill  MRN: [id number]        DOB: [birth year]          Sex: male ?  ?  ?REASON FOR VISIT:  ?  ?  ?Follow up ?  ?HISOTRY OF PRESENT ILLNESS:  ?  ?  ?Scott Hill is a 76 y.o. male who we are following for a abdominal aortic aneurysm.  His last ultrasound showed a 4.99 cm diameter aneurysm.  He was sent for CT scan.  He is back today for follow-up.  He denies abdominal pain or back pain ? ?The patient is a former smoker.  He is status post radiation for prostate cancer.  He takes a statin for hypercholesterolemia. ?  ?  ?PAST MEDICAL HISTORY:  ?  ?    ?Past Medical History:  ?Diagnosis Date  ? AAA (abdominal aortic aneurysm)    ? Arthritis    ? GERD (gastroesophageal reflux disease)    ?  occasionally  ? Hyperlipemia    ? Hypertension    ? Kidney stones    ? Prostate cancer ([place])    ?  ?  ?  ?FAMILY HISTORY:  ?  ?     ?Family History  ?Problem Relation Age of Onset  ? Diabetes Mother    ? Cancer Father    ? Diabetes Sister    ? Cancer Paternal Aunt    ? Cancer Paternal Uncle    ? Cancer Paternal Aunt    ? Cancer Paternal Aunt    ? Cancer Paternal Aunt    ? Cancer Paternal Aunt    ? Cancer Paternal Uncle    ? Cancer Paternal Uncle    ? Cancer Paternal Uncle    ? Cancer Paternal Uncle    ? Anemia Neg Hx    ? Arrhythmia Neg Hx    ? Asthma Neg Hx    ? Clotting disorder Neg Hx    ? Fainting Neg Hx    ? Heart attack Neg Hx    ? Heart disease Neg Hx    ? Heart failure Neg Hx    ? Hyperlipidemia Neg Hx    ? Hypertension Neg Hx    ?  ?  ?SOCIAL HISTORY:  ?  ?Social History  ?  ?     ?Tobacco Use  ? Smoking status: Former  ?    Packs/day: 1.00  ?    Years: 30.00  ?    Pack years: 30.00  ?    Types: Cigarettes  ?    Quit date: 03/30/1996  ?    Years since quitting: 25.2  ? Smokeless tobacco: Never  ?Substance Use Topics  ? Alcohol use: Yes  ?    Alcohol/week: 10.0 standard drinks  ?    Types: 10 Glasses of wine per week  ?     Comment: occassional  ?  ?  ?  ?ALLERGIES:  ?  ?No Known Allergies ?  ?  ?CURRENT MEDICATIONS:  ?  ?      ?Current Outpatient Medications  ?Medication Sig Dispense Refill  ? acetaminophen (TYLENOL) 325 MG tablet Take by mouth.      ? calcium-vitamin D (OSCAL WITH D) 500-200 MG-UNIT tablet Take 1 tablet by mouth.      ? losartan (COZAAR) 50 MG tablet Take 50 mg by mouth daily.      ? simvastatin (ZOCOR) 20 MG tablet        ?  tadalafil (CIALIS) 20 MG tablet        ? amoxicillin (AMOXIL) 500 MG capsule 500 mg as needed. Prior to dental appt (Patient not taking: Reported on 12/11/2020)      ?  ?No current facility-administered medications for this visit.  ?  ?  ?REVIEW OF SYSTEMS:  ?  ?'[X]'$  denotes positive finding, '[ ]'$  denotes negative finding ?Cardiac   Comments:  ?Chest pain or chest pressure:      ?Shortness of breath upon exertion:      ?Short of breath when lying flat:      ?Irregular heart rhythm:      ?       ?Vascular      ?Pain in calf, thigh, or hip brought on by ambulation:      ?Pain in feet at night that wakes you up from your sleep:       ?Blood clot in your veins:      ?Leg swelling:       ?       ?Pulmonary      ?Oxygen at home:      ?Productive cough:       ?Wheezing:       ?       ?Neurologic      ?Sudden weakness in arms or legs:       ?Sudden numbness in arms or legs:       ?Sudden onset of difficulty speaking or slurred speech:      ?Temporary loss of vision in one eye:       ?Problems with dizziness:       ?       ?Gastrointestinal      ?Blood in stool:       ?Vomited blood:       ?       ?Genitourinary      ?Burning when urinating:       ?Blood in urine:      ?       ?Psychiatric      ?Major depression:       ?       ?Hematologic      ?Bleeding problems:      ?Problems with blood clotting too easily:      ?       ?Skin      ?Rashes or ulcers:      ?       ?Constitutional      ?Fever or chills:      ?  ?  ?PHYSICAL EXAM:  ?  ?   ?Vitals:  ?  06/23/21 1401  ?BP: (!) 150/82  ?Pulse: 65  ?Resp: 20   ?Temp: 98.1 ?F (36.7 ?C)  ?SpO2: 96%  ?Weight: 229 lb (103.9 kg)  ?Height: '5\' 10"'$  (1.778 m)  ?  ?  ?GENERAL: The patient is a well-nourished male, in no acute distress. The vital signs are documented above. ?CARDIAC: There is a regular rate and rhythm.  ?VASCULAR: Palpable right dorsalis pedis pulse, nonpalpable left ?PULMONARY: Non-labored respirations ?ABDOMEN: Soft and non-tender MUSCULOSKELETAL: There are no major deformities or cyanosis. ?NEUROLOGIC: No focal weakness or paresthesias are detected. ?SKIN: There are no ulcers or rashes noted. ?PSYCHIATRIC: The patient has a normal affect. ?  ?STUDIES:  ?  ?I have reviewed the following: ?1. 5.6 cm infrarenal abdominal aortic aneurysm, previously 5.1 cm. ?Current guidelines recommend referral to a vascular specialist (the ?patient is currently under the care of  a vascular ?specialist/surgeon). ?2. 5.4 cm nonspecific right hepatic lesion, previously 3.6 cm in ?2005. 2 smaller segment 2 lesions are also noted. Dedicated ?outpatient liver MR with contrast may be useful for further ?characterization. ?3. Coronary and Aortic Atherosclerosis (ICD10-170.0). ?4. Sigmoid diverticulosis ?MEDICAL ISSUES:  ?  ?AAA: Maximum aortic diameter is 5.6 cm.  I discussed that he meets criteria for repair.  I think he would be a good candidate for endovascular treatment.  The details of the operation were discussed with the patient and his wife.  We also discussed the risk which include but are not limited to stroke, cardiac disease, access complications, renal failure, intestinal ischemia.  All of his questions were answered and he wishes to proceed. ? ?Prior to proceeding, I will get preoperative carotid duplex, ABIs and popliteal evaluation for aneurysmal disease.  He will also be referred to cardiology for cardiac clearance.  I would like to get him down within the next month. ?  ?  ?  ?Annamarie Major, IV, MD, FACS ?[facility] [place] ?Tel [phone] ?Pager [phone]  ?

## 2021-07-30 NOTE — Progress Notes (Addendum)
Mobility Specialist Progress Note ? ? 07/30/21 1715  ?Mobility  ?Activity Ambulated with assistance in hallway  ?Level of Assistance Contact guard assist, steadying assist  ?Assistive Device Front wheel walker  ?Distance Ambulated (ft) 470 ft  ?Activity Response Tolerated well  ?$Mobility charge 1 Mobility  ? ?Pre Mobility: 86 HR, 140/78 BP, 98% SpO2 ?During Mobility: 107 HR, 94%  SpO2 ?Post Mobility: 93 HR, 147/83 BP, 96% SpO2 ? ?Received pt in bed having min soreness at incision sites but agreeable. Slight pain to scoot EOB w/ MinA for UE support but asymptomatic for remainder of session. MinA for line management and safety aswell. Returned back to bed w/o fault and call bell in reach. ? ?Pt felt they could ambulated w/o RW, will f/u tomorrow w/ the possibility.  ? ?Holland Falling ?Mobility Specialist ?Phone Number [phone] ? ?

## 2021-07-31 LAB — CBC
HCT: 37.3 % — ABNORMAL LOW (ref 39.0–52.0)
Hemoglobin: 13 g/dL (ref 13.0–17.0)
MCH: 30.1 pg (ref 26.0–34.0)
MCHC: 34.9 g/dL (ref 30.0–36.0)
MCV: 86.3 fL (ref 80.0–100.0)
Platelets: 167 10*3/uL (ref 150–400)
RBC: 4.32 MIL/uL (ref 4.22–5.81)
RDW: 13.9 % (ref 11.5–15.5)
WBC: 8.6 10*3/uL (ref 4.0–10.5)
nRBC: 0 % (ref 0.0–0.2)

## 2021-07-31 LAB — BASIC METABOLIC PANEL
Anion gap: 7 (ref 5–15)
BUN: 18 mg/dL (ref 8–23)
CO2: 24 mmol/L (ref 22–32)
Calcium: 8.7 mg/dL — ABNORMAL LOW (ref 8.9–10.3)
Chloride: 107 mmol/L (ref 98–111)
Creatinine, Ser: 1.43 mg/dL — ABNORMAL HIGH (ref 0.61–1.24)
GFR, Estimated: 51 mL/min — ABNORMAL LOW (ref >60)
Glucose, Bld: 172 mg/dL — ABNORMAL HIGH (ref 70–99)
Potassium: 3.8 mmol/L (ref 3.5–5.1)
Sodium: 138 mmol/L (ref 135–145)

## 2021-07-31 MED ORDER — OXYCODONE-ACETAMINOPHEN 5-325 MG PO TABS: 1.0000 | ORAL_TABLET | Freq: Four times a day (QID) | ORAL | 0 refills | Status: DC | PRN

## 2021-07-31 NOTE — TOC Transition Note (Signed)
Transition of Care (TOC) - CM/SW Discharge Note ?Marvetta Gibbons Therapist, sports, BSN ?Transitions of Care ?Unit 4E- RN Case Manager ?See Treatment Team for direct phone #  ? ? ?Patient Details  ?Name: Scott Hill ?MRN: [id number] ?Date of Birth: [birth year] ? ?Transition of Care (TOC) CM/SW Contact:  ?Dahlia Client, Romeo Rabon, RN ?Phone Number: ?07/31/2021, 10:33 AM ? ? ?Clinical Narrative:    ?Pt stable for transition home today. S/p EVAR AAA repair. Transition of Care Department [facility]) has reviewed patient and no TOC needs have been identified at this time.  Family to transport home. ? ?Final next level of care: Home/Self Care ?Barriers to Discharge: No Barriers Identified ? ? ?Patient Goals and CMS Choice ?  ?  ?Choice offered to / list presented to : NA ? ?Discharge Placement ?  ?           ?  ? Home ?  ?  ? ?Discharge Plan and Services ?In-house Referral: NA ?Discharge Planning Services: NA ?Post Acute Care Choice: NA          ?DME Arranged: N/A ?DME Agency: NA ?  ?  ?  ?HH Arranged: NA ?[place] Agency: NA ?  ?  ?  ? ?Social Determinants of Health (SDOH) Interventions ?  ? ? ?Readmission Risk Interventions ? ?  07/31/2021  ? 10:33 AM  ?Readmission Risk Prevention Plan  ?Post Dischage Appt Complete  ?Medication Screening Complete  ?Transportation Screening Complete  ? ? ? ? ? ?

## 2021-07-31 NOTE — Progress Notes (Addendum)
[facility] [place] ? ?Subjective  - He is doing well, no pain and ready to go home. ? ? ?Objective ?(!) 145/70 ?67 ?97.6 ?F (36.4 ?C) (Oral) ?15 ?97% ? ?Intake/Output Summary (Last 24 hours) at 07/31/2021 0806 ?Last data filed at 07/31/2021 0630 ?Gross per 24 hour  ?Intake 1879.17 ml  ?Output 615 ml  ?Net 1264.17 ml  ? ? ?B groins soft without hematoma ?Abdomen soft, NTTP ?Feet warm and well perfused ?Heart RRR ?Lungs non labored breathing ? ?Assessment/Planning: ?POD # 1 EVAR repair of asymptomatic AAA 5.6 cm  ? ?He has ambulated, tol PO without N/V, and voided ?He will be discharged today with a f/u appt. For 4 weeks and CTA ?Stable disposition cont. Medical management with ASA and Statin daily ? ?[name] ?07/31/2021 ?8:06 AM ?-- ? ?Laboratory ?Lab Results: ?Recent Labs  ?  07/30/21 ?1205 07/31/21 ?0530  ?WBC 7.2 8.6  ?HGB 14.7 13.0  ?HCT 43.7 37.3*  ?PLT 173 167  ? ?BMET ?Recent Labs  ?  07/30/21 ?1205 07/31/21 ?0530  ?NA 138 138  ?K 3.9 3.8  ?CL 105 107  ?CO2 23 24  ?GLUCOSE 192* 172*  ?BUN 14 18  ?CREATININE 1.21 1.43*  ?CALCIUM 8.7* 8.7*  ? ? ?COAG ?Lab Results  ?Component Value Date  ? INR 1.0 07/30/2021  ? INR 1.0 07/25/2021  ? INR 0.95 11/16/2011  ? ?No results found for: PTT ? ?I agree with the above.  Have seen and evaluated patient.  He is postop day #1 from a endovascular aneurysm repair.  He does not have any abdominal pain he.  He denies diarrhea.  He is tolerating diet.  His pain is well controlled.  He has ambulated and voided.  Anticipate discharge home today with follow-up in 1 month with CT scan ? ?Scott Hill ? ?

## 2021-07-31 NOTE — Discharge Instructions (Signed)
   [facility]   Discharge Instructions  Endovascular Aortic Aneurysm Repair  Please refer to the following instructions for your post-procedure care. Your surgeon or Physician Assistant will discuss any changes with you.  Activity  You are encouraged to walk as much as you can. You can slowly return to normal activities but must avoid strenuous activity and heavy lifting until your doctor tells you it's OK. Avoid activities such as vacuuming or swinging a gold club. It is normal to feel tired for several weeks after your surgery. Do not drive until your doctor gives the OK and you are no longer taking prescription pain medications. It is also normal to have difficulty with sleep habits, eating, and bowel movements after surgery. These will go away with time.  Bathing/Showering  You may shower after you go home. If you have an incision, do not soak in a bathtub, hot tub, or swim until the incision heals completely.  Incision Care  Shower every day. Clean your incision with mild soap and water. Pat the area dry with a clean towel. You do not need a bandage unless otherwise instructed. Do not apply any ointments or creams to your incision. If you clothing is irritating, you may cover your incision with a dry gauze pad.  Diet  Resume your normal diet. There are no special food restrictions following this procedure. A low fat/low cholesterol diet is recommended for all patients with vascular disease. In order to heal from your surgery, it is CRITICAL to get adequate nutrition. Your body requires vitamins, minerals, and protein. Vegetables are the best source of vitamins and minerals. Vegetables also provide the perfect balance of protein. Processed food has little nutritional value, so try to avoid this.  Medications  Resume taking all of your medications unless your doctor or nurse practitioner tells you not to. If your incision is causing pain, you may take  over-the-counter pain relievers such as acetaminophen (Tylenol). If you were prescribed a stronger pain medication, please be aware these medications can cause nausea and constipation. Prevent nausea by taking the medication with a snack or meal. Avoid constipation by drinking plenty of fluids and eating foods with a high amount of fiber, such as fruits, vegetables, and grains. Do not take Tylenol if you are taking prescription pain medications.   Follow up  Our office will schedule a follow-up appointment with a C.T. scan 3-4 weeks after your surgery.  Please call us immediately for any of the following conditions  Severe or worsening pain in your legs or feet or in your abdomen back or chest. Increased pain, redness, drainage (pus) from your incision sit. Increased abdominal pain, bloating, nausea, vomiting or persistent diarrhea. Fever of 101 degrees or higher. Swelling in your leg (s),  Reduce your risk of vascular disease  Stop smoking. If you would like help call [facility] at 1-800-QUIT-NOW (1-800-784-8669) or [facility] at [phone]. Manage your cholesterol Maintain a desired weight Control your diabetes Keep your blood pressure down  If you have questions, please call the office at [phone].   

## 2021-08-01 ENCOUNTER — Encounter (HOSPITAL_COMMUNITY): Payer: Self-pay | Admitting: Surgery

## 2021-08-02 ENCOUNTER — Other Ambulatory Visit: Payer: Self-pay

## 2021-08-02 ENCOUNTER — Encounter (HOSPITAL_COMMUNITY): Payer: Self-pay

## 2021-08-02 ENCOUNTER — Emergency Department (HOSPITAL_COMMUNITY)
Admission: EM | Admit: 2021-08-02 | Discharge: 2021-08-02 | Disposition: A | Discharge status: Home / Self Care | Payer: Medicare HMO | Attending: Student | Admitting: Student

## 2021-08-02 ENCOUNTER — Emergency Department (HOSPITAL_COMMUNITY): Payer: Medicare HMO

## 2021-08-02 DIAGNOSIS — I3139 Other pericardial effusion (noninflammatory): Secondary | ICD-10-CM | POA: Diagnosis not present

## 2021-08-02 DIAGNOSIS — Z955 Presence of coronary angioplasty implant and graft: Secondary | ICD-10-CM | POA: Insufficient documentation

## 2021-08-02 DIAGNOSIS — E1140 Type 2 diabetes mellitus with diabetic neuropathy, unspecified: Secondary | ICD-10-CM | POA: Diagnosis not present

## 2021-08-02 DIAGNOSIS — R42 Dizziness and giddiness: Secondary | ICD-10-CM | POA: Diagnosis not present

## 2021-08-02 DIAGNOSIS — R079 Chest pain, unspecified: Secondary | ICD-10-CM | POA: Insufficient documentation

## 2021-08-02 DIAGNOSIS — Z7982 Long term (current) use of aspirin: Secondary | ICD-10-CM | POA: Insufficient documentation

## 2021-08-02 DIAGNOSIS — D649 Anemia, unspecified: Secondary | ICD-10-CM | POA: Diagnosis not present

## 2021-08-02 DIAGNOSIS — R531 Weakness: Secondary | ICD-10-CM | POA: Diagnosis not present

## 2021-08-02 DIAGNOSIS — R7989 Other specified abnormal findings of blood chemistry: Secondary | ICD-10-CM | POA: Diagnosis not present

## 2021-08-02 DIAGNOSIS — I714 Abdominal aortic aneurysm, without rupture, unspecified: Secondary | ICD-10-CM | POA: Insufficient documentation

## 2021-08-02 DIAGNOSIS — M4317 Spondylolisthesis, lumbosacral region: Secondary | ICD-10-CM | POA: Diagnosis not present

## 2021-08-02 DIAGNOSIS — Z79899 Other long term (current) drug therapy: Secondary | ICD-10-CM | POA: Diagnosis not present

## 2021-08-02 DIAGNOSIS — I10 Essential (primary) hypertension: Secondary | ICD-10-CM | POA: Insufficient documentation

## 2021-08-02 DIAGNOSIS — K5730 Diverticulosis of large intestine without perforation or abscess without bleeding: Secondary | ICD-10-CM | POA: Diagnosis not present

## 2021-08-02 DIAGNOSIS — Z01818 Encounter for other preprocedural examination: Secondary | ICD-10-CM | POA: Diagnosis not present

## 2021-08-02 DIAGNOSIS — I712 Thoracic aortic aneurysm, without rupture, unspecified: Secondary | ICD-10-CM | POA: Diagnosis not present

## 2021-08-02 LAB — CBC WITH DIFFERENTIAL/PLATELET
Abs Immature Granulocytes: 0.04 10*3/uL (ref 0.00–0.07)
Basophils Absolute: 0 10*3/uL (ref 0.0–0.1)
Basophils Relative: 0 %
Eosinophils Absolute: 0.1 10*3/uL (ref 0.0–0.5)
Eosinophils Relative: 1 %
HCT: 38.6 % — ABNORMAL LOW (ref 39.0–52.0)
Hemoglobin: 12.9 g/dL — ABNORMAL LOW (ref 13.0–17.0)
Immature Granulocytes: 1 %
Lymphocytes Relative: 8 %
Lymphs Abs: 0.6 10*3/uL — ABNORMAL LOW (ref 0.7–4.0)
MCH: 29.6 pg (ref 26.0–34.0)
MCHC: 33.4 g/dL (ref 30.0–36.0)
MCV: 88.5 fL (ref 80.0–100.0)
Monocytes Absolute: 0.7 10*3/uL (ref 0.1–1.0)
Monocytes Relative: 8 %
Neutro Abs: 6.4 10*3/uL (ref 1.7–7.7)
Neutrophils Relative %: 82 %
Platelets: 165 10*3/uL (ref 150–400)
RBC: 4.36 MIL/uL (ref 4.22–5.81)
RDW: 13.7 % (ref 11.5–15.5)
WBC: 7.8 10*3/uL (ref 4.0–10.5)
nRBC: 0 % (ref 0.0–0.2)

## 2021-08-02 LAB — URINALYSIS, ROUTINE W REFLEX MICROSCOPIC
Bilirubin Urine: NEGATIVE
Glucose, UA: 50 mg/dL — AB
Ketones, ur: 20 mg/dL — AB
Leukocytes,Ua: NEGATIVE
Nitrite: NEGATIVE
Protein, ur: NEGATIVE mg/dL
Specific Gravity, Urine: 1.034 — ABNORMAL HIGH (ref 1.005–1.030)
pH: 5 (ref 5.0–8.0)

## 2021-08-02 LAB — COMPREHENSIVE METABOLIC PANEL
ALT: 33 U/L (ref 0–44)
AST: 23 U/L (ref 15–41)
Albumin: 3.3 g/dL — ABNORMAL LOW (ref 3.5–5.0)
Alkaline Phosphatase: 80 U/L (ref 38–126)
Anion gap: 8 (ref 5–15)
BUN: 14 mg/dL (ref 8–23)
CO2: 25 mmol/L (ref 22–32)
Calcium: 8.5 mg/dL — ABNORMAL LOW (ref 8.9–10.3)
Chloride: 99 mmol/L (ref 98–111)
Creatinine, Ser: 1.44 mg/dL — ABNORMAL HIGH (ref 0.61–1.24)
GFR, Estimated: 51 mL/min — ABNORMAL LOW (ref >60)
Glucose, Bld: 180 mg/dL — ABNORMAL HIGH (ref 70–99)
Potassium: 3.7 mmol/L (ref 3.5–5.1)
Sodium: 132 mmol/L — ABNORMAL LOW (ref 135–145)
Total Bilirubin: 1.7 mg/dL — ABNORMAL HIGH (ref 0.3–1.2)
Total Protein: 6.1 g/dL — ABNORMAL LOW (ref 6.5–8.1)

## 2021-08-02 LAB — LACTIC ACID, PLASMA: Lactic Acid, Venous: 1 mmol/L (ref 0.5–1.9)

## 2021-08-02 MED ORDER — IOHEXOL 350 MG/ML SOLN
100.0000 mL | Freq: Once | INTRAVENOUS | Status: AC | PRN
  Administered 2021-08-02: 100 mL via INTRAVENOUS

## 2021-08-02 NOTE — Discharge Instructions (Addendum)
I'm sorry for the long day that you had today, but fortunately we only have good news to tell you.  Your labs were quite normal.  We were concerned that you were experiencing a postoperative problem from your vascular procedure the other day, however the CT scan we obtained shows that everything looks good.  Additionally, when you complain of weakness on one side of your body, we get concerned about things such as stroke.  CT of your head was also normal.  It is possible that this is a side effect from your oxycodone, but I have given you a neuro referral to possibly obtain outpatient MRI. If symptoms return, please return to the ED for evaluation. ?

## 2021-08-02 NOTE — ED Provider Notes (Signed)
?[facility] ?Provider Note ? ? ?CSN: [id number] ?Arrival date & time: 08/02/21  1251 ? ?  ? ?History ? ?Chief complaint: Weakness ? ? ?Scott Hill is a 76 y.o. male with complaint of weakness following a cardiac stent procedure of the aortic stent graft on Wednesday.  Procedure without complications, patient discharged home and felt fine until the following morning.  When he woke up, he noticed significant weakness of the left leg.  Has never had this before.  Found it difficult to walk or bear weight.  The patient's wife states he collapsed 2-3 times from weakness, did not hit head or lose consciousness.  No Hx of CVA.  Noticed slight improvement of the weakness over the past few days, but now just feels overall weak and like his legs can't hold his own weight.  Denies chest pain, syncope, shortness of breath or dizziness.  Denies chills or fevers.  Denies numbness or tingling of the upper or lower extremities.  Denies urinary symptoms or diarrhea. ? ?The history is provided by the patient, medical records and the spouse.  ? ?  ? ?Home Medications ?Prior to Admission medications   ?Medication Sig Start Date End Date Taking? Authorizing Provider  ?amoxicillin (AMOXIL) 500 MG capsule Take 2,000 mg by mouth See admin instructions. Take 4 capsules (2000 mg) by mouth 1 hour prior to dental appointments. 11/28/19   [provider]  ?aspirin EC 81 MG tablet Take 1 tablet (81 mg total) by mouth daily. Swallow whole. 06/25/21   Early Osmond, MD  ?calcium-vitamin D (OSCAL WITH D) 500-200 MG-UNIT tablet Take 1 tablet by mouth in the morning.    [provider]  ?losartan (COZAAR) 50 MG tablet Take 100 mg by mouth in the morning. 05/06/21   [provider]  ?oxyCODONE-acetaminophen (PERCOCET/ROXICET) 5-325 MG tablet Take 1 tablet by mouth every 6 (six) hours as needed for moderate pain. 07/31/21   Ulyses Amor, PA-C  ?simvastatin (ZOCOR) 20 MG tablet Take 10  mg by mouth in the morning. 11/01/19   [provider]  ?   ? ?Allergies    ?Patient has no known allergies.   ? ?Review of Systems   ?Review of Systems  ?Musculoskeletal:  Positive for gait problem.  ?Neurological:  Positive for weakness (Overall, and left leg focal).  ? ?Physical Exam ?Updated Vital Signs ?BP (!) 147/77   Pulse 72   Temp (!) 100.4 ?F (38 ?C) (Rectal)   Resp 17   SpO2 97%  ?Physical Exam ?Vitals and nursing note reviewed.  ?Constitutional:   ?   General: He is not in acute distress. ?   Appearance: He is well-developed.  ?HENT:  ?   Head: Normocephalic and atraumatic.  ?Eyes:  ?   General: No visual field deficit. ?   Conjunctiva/sclera: Conjunctivae normal.  ?Cardiovascular:  ?   Rate and Rhythm: Normal rate and regular rhythm.  ?   Pulses: Normal pulses.     ?     Radial pulses are 2+ on the right side and 2+ on the left side.  ?     Dorsalis pedis pulses are 2+ on the right side and 2+ on the left side.  ?     Posterior tibial pulses are 2+ on the right side and 2+ on the left side.  ?   Heart sounds: No murmur heard. ?Pulmonary:  ?   Effort: Pulmonary effort is normal. No accessory muscle usage or  respiratory distress.  ?   Breath sounds: Normal breath sounds.  ?Chest:  ?   Chest wall: No mass, lacerations, swelling, tenderness or crepitus.  ?Abdominal:  ?   Palpations: Abdomen is soft.  ?   Tenderness: There is no abdominal tenderness. There is no guarding. Negative signs include Murphy's sign.  ?Musculoskeletal:     ?   General: No swelling or tenderness.  ?   Cervical back: Neck supple.  ?   Right lower leg: No edema.  ?   Left lower leg: No edema.  ?Skin: ?   General: Skin is warm and dry.  ?   Capillary Refill: Capillary refill takes less than 2 seconds.  ?   Coloration: Skin is not jaundiced.  ?Neurological:  ?   Mental Status: He is alert and oriented to person, place, and time.  ?   GCS: GCS eye subscore is 4. GCS verbal subscore is 5. GCS motor subscore is 6.  ?   Cranial  Nerves: No cranial nerve deficit, dysarthria or facial asymmetry.  ?   Sensory: No sensory deficit.  ?   Motor: Weakness present. No tremor or pronator drift.  ?   Coordination: Coordination normal. Finger-Nose-Finger Test and Heel to Yardley Test normal.  ?   Comments: Mild weakness of the left lower leg  ?Psychiatric:     ?   Mood and Affect: Mood normal.  ? ? ?ED Results / Procedures / Treatments   ?Labs ?(all labs ordered are listed, but only abnormal results are displayed) ?Labs Reviewed  ?COMPREHENSIVE METABOLIC PANEL - Abnormal; Notable for the following components:  ?    Result Value  ? Sodium 132 (*)   ? Glucose, Bld 180 (*)   ? Creatinine, Ser 1.44 (*)   ? Calcium 8.5 (*)   ? Total Protein 6.1 (*)   ? Albumin 3.3 (*)   ? Total Bilirubin 1.7 (*)   ? GFR, Estimated 51 (*)   ? All other components within normal limits  ?CBC WITH DIFFERENTIAL/PLATELET - Abnormal; Notable for the following components:  ? Hemoglobin 12.9 (*)   ? HCT 38.6 (*)   ? Lymphs Abs 0.6 (*)   ? All other components within normal limits  ?LACTIC ACID, PLASMA  ?URINALYSIS, ROUTINE W REFLEX MICROSCOPIC  ? ? ?EKG ?None ? ?Radiology ?DG Chest 2 View ? ?Result Date: 08/02/2021 ?CLINICAL DATA:  back pain EXAM: CHEST - 2 VIEW COMPARISON:  Chest x-ray dated Jul 30, 2020 FINDINGS: The heart size and mediastinal contours are within normal limits. Both lungs are clear. The visualized skeletal structures are unremarkable. IMPRESSION: No active cardiopulmonary disease. Electronically Signed   By: Yetta Glassman M.D.   On: 08/02/2021 14:12  ? ?Procedures ?Procedures  ? ? ?Medications Ordered in ED ?Medications  ?iohexol (OMNIPAQUE) 350 MG/ML injection 100 mL (100 mLs Intravenous Contrast Given 08/02/21 1549)  ? ? ?ED Course/ Medical Decision Making/ A&P ?  ?                        ?Medical Decision Making ?Amount and/or Complexity of Data Reviewed ?External Data Reviewed: notes. ?Labs: ordered. Decision-making details documented in ED Course. ?Radiology:  ordered. Decision-making details documented in ED Course. ?ECG/medicine tests: ordered. Decision-making details documented in ED Course. ? ?Risk ?OTC drugs. ?Prescription drug management. ? ? ?76 y.o. male presents to the ED for concern of weakness post-surgery. ?    ?This involves an extensive number of treatment  options, and is a complaint that carries with it a high risk of complications and morbidity.  The emergent differential diagnosis prior to evaluation includes, but is not limited to: Aortic dissection, ACS, pulmonary embolism, pneumothorax, pleural effusion, pulmonary edema, sepsis ? ?This is not an exhaustive differential.  ? ?Past Medical History / Co-morbidities / Social History: ?Hx of hypertension, diabetic neuropathy, prostate malignancy, GERD, AAA, recent aortic stent placement ? ?Additional History:  ?Internal and external records from outside source obtained and reviewed including ED visits, vascular surgery, cardiology ? ?Physical Exam: ?Physical exam performed. The pertinent findings include: Mild weakness of the left lower extremity.  Lower extremities appear neurovascularly intact. ? ?Lab Tests: ?I ordered, and personally interpreted labs.  The pertinent results include:   ?CBC: Mild elevated creatinine 1.44, consistent with previous recent values.  Otherwise unremarkable ?CMP/BMP: Mild decreased Hgb and HCT, which again are consistent with previous recent values. ? ?Imaging Studies: ?I ordered imaging studies including CXR and CTA chest abdomen and pelvis.  I personally viewed and interpreted the chest x-ray, which showed no acute cardiac or pulmonary pathology.  I agree with the radiologist interpretation.  CTA pending. ? ?Cardiac Monitoring: ?The patient was maintained on a cardiac monitor.  I personally viewed and interpreted the cardiac monitored which showed an underlying rhythm of: Sinus tachycardia normal sinus rhythm ? ?I personally ordered EKG -- pending ? ?Medications: ?None ? ?ED  Course/Disposition: ?Pt well-appearing on exam.  Does not seem septic or toxic appearing.  This past Wednesday had an aortic stent placed, due to history of worsening AAA.  New full body weakness, with emphasis o

## 2021-08-02 NOTE — ED Provider Notes (Signed)
Care assumed from [place], [place] at shift change, please see their note for full detail, but in brief Scott Hill is a 76 y.o. male presents with leg weakness, left. ? ? ? ?Physical Exam  ?BP 117/74   Pulse 96   Temp (!) 100.4 ?F (38 ?C) (Rectal)   Resp 18   SpO2 95%  ? ?Physical Exam ?Neuro: Speech is clear, able to follow commands ?CN III-XII intact ?Normal strength in upper and lower extremities bilaterally including dorsiflexion and plantar flexion, strong and equal grip strength ?Sensation normal to light and sharp touch ?Moves extremities without ataxia, coordination intact ?Normal finger to nose and rapid alternating movements ?No pronator drift ? ?Procedures  ?Procedures ? ?ED Course / MDM  ?  ?Medical Decision Making ?Amount and/or Complexity of Data Reviewed ?Labs: ordered. ?Radiology: ordered. ? ?Risk ?Prescription drug management. ? ? ?Plan at time of shift change was likely discharge, however, after speaking with the patient myself, he states that he had unilateral leg weakness, multiple falls because of this.  My neuro exam was without focal deficits.  He notes that his symptoms have completely improved on its own about 1 hour after arrival.  Currently denies numbness, tingling, weakness and all other complaints.  Patient's wife states that patient has a history of reacting poorly to oxycodone and thinks that his symptoms are likely from this and he is improving now that the medication has worn off.  However, given his complaint and speaking with my attending Dr. Sabra Heck, CT head was obtained to rule out stroke.  I reviewed and interpreted this imaging myself and agree with radiologist interpretation which was negative for acute findings.  I consulted with Dr. Cheral Marker, with neurology regarding this patient I discussed all history, labs and findings with him.  He states that given the fact that patient has had symptoms for approximately 3 days with spontaneous resolution, he does not meet  criteria for TIA.  Additionally, it would be very bizarre if this was a stroke that his symptoms completely resolved on its own 2 or 3 days after symptom onset.  In the setting of negative CT scan and 100% resolution of symptoms, he says it "would not be against standard of care to discharge patient without MRI or to have MRI performed outpatient." I re-evaluated patient who insists that all complaints have resolved.  I had patient ambulate and he was able to walk up and down the hallway without any evidence of weakness or gait imbalance.  Patient wishes to go home and not have any further testing done, and given very reassuring work-up, resolution of symptoms and neurology consult I think this is a reasonable option.  My attending Dr. Sabra Heck is aware of this patient, and agrees with plan.  Patient was discharged home in good condition with neurology follow-up. ? ? ? ?  ?Tonye Pearson, [place] ?08/02/21 2237 ? ?  ?Noemi Chapel, MD ?08/05/21 1614 ? ?

## 2021-08-02 NOTE — ED Triage Notes (Signed)
Patient reports that he aortic stent placed this past Wednesday and since the procedure weakness where is struggling to complete ADLs. Denies pain ?

## 2021-08-05 NOTE — Discharge Summary (Signed)
[facility] Discharge Summary ? ? ?Patient ID:  ?Scott Hill ?MRN: [id number] ?DOB/AGE: 09-25-1945 76 y.o. ? ?Admit date: 07/30/2021 ?Discharge date: 07/31/21 ?Date of Surgery: 07/30/2021 ?Surgeon: Surgeon(s): ?Serafina Mitchell, MD ?Marty Heck, MD ? ?Admission Diagnosis: ?AAA (abdominal aortic aneurysm) ([place]) [I71.40] ? ?Discharge Diagnoses:  ?AAA (abdominal aortic aneurysm) ([place]) [I71.40] ? ?Secondary Diagnoses: ?Past Medical History:  ?Diagnosis Date  ? AAA (abdominal aortic aneurysm) ([place])   ? Arthritis   ? GERD (gastroesophageal reflux disease)   ? occasionally  ? Hyperlipemia   ? Hypertension   ? Kidney stones   ? Prostate cancer ([place])   ? ? ?Procedure(s): ?ABDOMINAL AORTIC ENDOVASCULAR STENT GRAFT (GORE) ?ULTRASOUND GUIDANCE FOR VASCULAR ACCESS, BILATERAL FEMORAL ARTERIES ? ?Discharged Condition: good ? ?HPI: Scott Hill is a 76 y.o. male who we are following for a asymptomatic abdominal aortic aneurysm with Maximum aortic diameter is 5.6 cm.  He is scheduled for repair. ? ? ?Hospital Course:  ?[name] is a 76 y.o. male is S/P  ?Procedure(s): ?ABDOMINAL AORTIC ENDOVASCULAR STENT GRAFT (GORE) ?ULTRASOUND GUIDANCE FOR VASCULAR ACCESS, BILATERAL FEMORAL ARTERIES ?He had an uneventful stay over night. ?He has ambulated, tol PO without N/V, and voided ?He will be discharged today with a f/u appt. For 4 weeks and CTA ?Stable disposition cont. Medical management with ASA and Statin daily ? ? ?Significant Diagnostic Studies: ?CBC ?Lab Results  ?Component Value Date  ? WBC 7.8 08/02/2021  ? HGB 12.9 (L) 08/02/2021  ? HCT 38.6 (L) 08/02/2021  ? MCV 88.5 08/02/2021  ? PLT 165 08/02/2021  ? ? ?BMET ?   ?Component Value Date/Time  ? NA 132 (L) 08/02/2021 1330  ? K 3.7 08/02/2021 1330  ? CL 99 08/02/2021 1330  ? CO2 25 08/02/2021 1330  ? GLUCOSE 180 (H) 08/02/2021 1330  ? BUN 14 08/02/2021 1330  ? CREATININE 1.44 (H) 08/02/2021 1330  ? CALCIUM 8.5 (L) 08/02/2021 1330  ? GFRNONAA 51 (L)  08/02/2021 1330  ? GFRAA 72 (L) 11/25/2011 0600  ? ?COAG ?Lab Results  ?Component Value Date  ? INR 1.0 07/30/2021  ? INR 1.0 07/25/2021  ? INR 0.95 11/16/2011  ? ? ? ?Disposition:  ?Discharge to :Home ?Discharge Instructions   ? ? Call MD for:  redness, tenderness, or signs of infection (pain, swelling, bleeding, redness, odor or green/yellow discharge around incision site)   Complete by: As directed ?  ? Call MD for:  severe or increased pain, loss or decreased feeling  in affected limb(s)   Complete by: As directed ?  ? Call MD for:  temperature >100.5   Complete by: As directed ?  ? Resume previous diet   Complete by: As directed ?  ? ?  ? ?Allergies as of 07/31/2021   ?No Known Allergies ?  ? ?  ?Medication List  ?  ? ?TAKE these medications   ? ?amoxicillin 500 MG capsule ?Commonly known as: AMOXIL ?Take 2,000 mg by mouth See admin instructions. Take 4 capsules (2000 mg) by mouth 1 hour prior to dental appointments. ?  ?aspirin EC 81 MG tablet ?Take 1 tablet (81 mg total) by mouth daily. Swallow whole. ?  ?calcium-vitamin D 500-200 MG-UNIT tablet ?Commonly known as: OSCAL WITH D ?Take 1 tablet by mouth in the morning. ?  ?losartan 50 MG tablet ?Commonly known as: COZAAR ?Take 100 mg by mouth in the morning. ?  ?oxyCODONE-acetaminophen 5-325 MG tablet ?Commonly known as: PERCOCET/ROXICET ?Take 1 tablet by mouth  every 6 (six) hours as needed for moderate pain. ?  ?simvastatin 20 MG tablet ?Commonly known as: ZOCOR ?Take 10 mg by mouth in the morning. ?  ? ?  ? ?Verbal and written Discharge instructions given to the patient. Wound care per Discharge AVS ? Follow-up Information   ? ? Serafina Mitchell, MD Follow up in 4 week(s).   ?Specialties: Vascular Surgery, Cardiology ?Why: Office will call you to arrange your appt (sent) ?Contact information: ?[address] ?[place] [address] ?[phone] ? ? ?  ?  ? ?  ?  ? ?  ? ? ?Signed: ?[name] ?08/05/2021, 8:20 AM ?- For [facility] Registry use --- ?Instructions:  Press F2 to tab through selections.  Delete question if not applicable.  ? ?Post-op:  ?Time to Extubation: '[ ]'$  In OR, '[ ]'$  < 12 hrs, '[ ]'$  12-24 hrs, '[ ]'$  >=24 hrs ?Vasopressors Req. Post-op: No ?MI: '[ ]'$  No, '[ ]'$  Troponin only, '[ ]'$  EKG or Clinical ?New Arrhythmia: No ?CHF: No ?ICU Stay: 0 days ?Transfusion: No  If yes, 0 units given ? ?Complications: ?Resp failure: [ x] none, '[ ]'$  Pneumonia, '[ ]'$  Ventilator ?Chg in renal function: [x ] none, '[ ]'$  Inc. Cr > 0.5, '[ ]'$  Temp. Dialysis, '[ ]'$  Permanent dialysis ?Leg ischemia: [x ] No, '[ ]'$  Yes, no Surgery needed, '[ ]'$  Yes, Surgery needed, '[ ]'$  Amputation ?Bowel ischemia: [ x] No, '[ ]'$  Medical Rx, '[ ]'$  Surgical Rx ?Wound complication: [ x] No, '[ ]'$  Superficial separation/infection, '[ ]'$  Return to OR ?Return to OR: No  ?Return to OR for bleeding: No ?Stroke: [x ] None, '[ ]'$  Minor, '[ ]'$  Major ? ?Discharge medications: ?Statin use:  Yes ?ASA use:  Yes ?Plavix use:  No  for medical reason not indicated ?Beta blocker use:  No  for medical reason not indicated ?

## 2021-09-03 ENCOUNTER — Other Ambulatory Visit: Payer: Self-pay

## 2021-09-03 DIAGNOSIS — I7143 Infrarenal abdominal aortic aneurysm, without rupture: Secondary | ICD-10-CM

## 2021-09-05 ENCOUNTER — Ambulatory Visit
Admission: RE | Admit: 2021-09-05 | Discharge: 2021-09-05 | Disposition: A | Discharge status: Home / Self Care | Payer: Medicare HMO | Source: Ambulatory Visit | Attending: Surgery | Admitting: Surgery

## 2021-09-05 DIAGNOSIS — I728 Aneurysm of other specified arteries: Secondary | ICD-10-CM | POA: Diagnosis not present

## 2021-09-05 DIAGNOSIS — I7143 Infrarenal abdominal aortic aneurysm, without rupture: Secondary | ICD-10-CM

## 2021-09-05 DIAGNOSIS — J439 Emphysema, unspecified: Secondary | ICD-10-CM | POA: Diagnosis not present

## 2021-09-05 DIAGNOSIS — I714 Abdominal aortic aneurysm, without rupture, unspecified: Secondary | ICD-10-CM | POA: Diagnosis not present

## 2021-09-05 DIAGNOSIS — Z01818 Encounter for other preprocedural examination: Secondary | ICD-10-CM | POA: Diagnosis not present

## 2021-09-05 DIAGNOSIS — E278 Other specified disorders of adrenal gland: Secondary | ICD-10-CM | POA: Diagnosis not present

## 2021-09-05 MED ORDER — IOPAMIDOL (ISOVUE-370) INJECTION 76%
100.0000 mL | Freq: Once | INTRAVENOUS | Status: AC | PRN
  Administered 2021-09-05: 100 mL via INTRAVENOUS

## 2021-09-08 ENCOUNTER — Encounter: Payer: Self-pay | Admitting: Surgery

## 2021-09-08 ENCOUNTER — Ambulatory Visit (INDEPENDENT_AMBULATORY_CARE_PROVIDER_SITE_OTHER): Payer: Medicare HMO | Admitting: Surgery

## 2021-09-08 VITALS — BP 157/91 | HR 60 | Temp 98.2°F | Resp 20 | Ht 69.0 in | Wt 220.0 lb

## 2021-09-08 DIAGNOSIS — I7143 Infrarenal abdominal aortic aneurysm, without rupture: Secondary | ICD-10-CM

## 2021-09-08 DIAGNOSIS — I728 Aneurysm of other specified arteries: Secondary | ICD-10-CM

## 2021-09-08 NOTE — Progress Notes (Signed)
Patient name: Jsoeph Hill MRN: [id number] DOB: 02-12-1946 Sex: male  REASON FOR VISIT:     Post op  HISTORY OF PRESENT ILLNESS:   Scott Hill is a 76 y.o. male who is status post endovascular aneurysm repair for a 5.6 cm infrarenal abdominal aortic aneurysm on 07/30/2021.  His postoperative course was uncomplicated.  He did have a fall several days after his operation while he was at home.  He came to the emergency department and scans were all negative.  He does complain of some back and flank pain that comes about with activities such as weed eating but this appears to be getting better   The patient is a former smoker.  He is status post radiation for prostate cancer.  He takes a statin for hypercholesterolemia.    CURRENT MEDICATIONS:    Current Outpatient Medications  Medication Sig Dispense Refill   amoxicillin (AMOXIL) 500 MG capsule Take 2,000 mg by mouth See admin instructions. Take 4 capsules (2000 mg) by mouth 1 hour prior to dental appointments.     aspirin EC 81 MG tablet Take 1 tablet (81 mg total) by mouth daily. Swallow whole. 90 tablet 3   calcium-vitamin D (OSCAL WITH D) 500-200 MG-UNIT tablet Take 1 tablet by mouth in the morning.     losartan (COZAAR) 50 MG tablet Take 100 mg by mouth in the morning.     simvastatin (ZOCOR) 20 MG tablet Take 10 mg by mouth in the morning.     oxyCODONE-acetaminophen (PERCOCET/ROXICET) 5-325 MG tablet Take 1 tablet by mouth every 6 (six) hours as needed for moderate pain. (Patient not taking: Reported on 09/08/2021) 12 tablet 0   No current facility-administered medications for this visit.    REVIEW OF SYSTEMS:   '[X]'$  denotes positive finding, '[ ]'$  denotes negative finding Cardiac  Comments:  Chest pain or chest pressure:    Shortness of breath upon exertion:    Short of breath when lying flat:    Irregular heart rhythm:    Constitutional    Fever or chills:      PHYSICAL EXAM:    Vitals:   09/08/21 0917  BP: (!) 157/91  Pulse: 60  Resp: 20  Temp: 98.2 F (36.8 C)  SpO2: 98%  Weight: 220 lb (99.8 kg)  Height: '5\' 9"'$  (1.753 m)    GENERAL: The patient is a well-nourished male, in no acute distress. The vital signs are documented above. CARDIOVASCULAR: There is a regular rate and rhythm. PULMONARY: Non-labored respirations Palpable femoral pulses  STUDIES:   I have reviewed the following CT scan: 1. No evidence of thoracic aortic aneurysmal disease. 2. Mild emphysema. Bronchovascular nodularity in the right middle lobe appears improved since the prior scan in May consistent with resolving inflammatory process. 3. Status post EVAR with no evidence of complication. Stable aneurysm sac size of approximately 4.9 x 5.5 cm without evidence of endoleak. 4. 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 5. Stable benign hyperplasia/nodularity of the adrenal glands. 6. Stable bilateral renal cysts that do not require follow-up including a small chronic hemorrhagic cyst of the left kidney which shows some evidence of involution over time.     MEDICAL ISSUES:   AAA: CT scan shows successful exclusion of his aneurysm with no evidence of endoleak.  He will follow-up in 6 months with a repeat ultrasound.  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  Leia Alf, MD, FACS [facility] [phone] Pager [phone]

## 2021-09-11 ENCOUNTER — Other Ambulatory Visit: Payer: Self-pay | Admitting: *Deleted

## 2021-09-11 DIAGNOSIS — I728 Aneurysm of other specified arteries: Secondary | ICD-10-CM

## 2021-09-11 DIAGNOSIS — I7143 Infrarenal abdominal aortic aneurysm, without rupture: Secondary | ICD-10-CM

## 2021-09-29 DIAGNOSIS — Z9889 Other specified postprocedural states: Secondary | ICD-10-CM | POA: Diagnosis not present

## 2021-09-29 DIAGNOSIS — I129 Hypertensive chronic kidney disease with stage 1 through stage 4 chronic kidney disease, or unspecified chronic kidney disease: Secondary | ICD-10-CM | POA: Diagnosis not present

## 2021-09-29 DIAGNOSIS — Z8546 Personal history of malignant neoplasm of prostate: Secondary | ICD-10-CM | POA: Diagnosis not present

## 2021-09-29 DIAGNOSIS — E7800 Pure hypercholesterolemia, unspecified: Secondary | ICD-10-CM | POA: Diagnosis not present

## 2021-09-29 DIAGNOSIS — N1831 Chronic kidney disease, stage 3a: Secondary | ICD-10-CM | POA: Diagnosis not present

## 2021-09-29 DIAGNOSIS — Z8679 Personal history of other diseases of the circulatory system: Secondary | ICD-10-CM | POA: Diagnosis not present

## 2021-09-29 DIAGNOSIS — Z23 Encounter for immunization: Secondary | ICD-10-CM | POA: Diagnosis not present

## 2021-09-29 DIAGNOSIS — Z6833 Body mass index (BMI) 33.0-33.9, adult: Secondary | ICD-10-CM | POA: Diagnosis not present

## 2021-12-23 NOTE — Progress Notes (Addendum)
Cardiology Office Note:    Date:  12/25/2021   ID:  Scott Hill, DOB December 11, 1945, MRN [id number]  PCP:  Sigmund Hazel, MD   [facility] Providers Cardiologist:  Alverda Skeans, MD Referring MD: Sigmund Hazel, MD   Chief Complaint/Reason for Referral: Cardiology follow-up  ASSESSMENT:    1. Abdominal aortic aneurysm (AAA) without rupture, unspecified part (HCC)   2. Coronary artery calcification seen on CAT scan   3. Hypertension, unspecified type   4. Hyperlipidemia LDL goal <70   5. Aortic atherosclerosis (HCC)      PLAN:    In order of problems listed above: 1.  AAA status post repair: Continue aspirin, losartan, simvastatin and start Toprol-XL 12.5 mg at bedtime 3.  Coronary artery calcification: Continue aspirin 81 mg and continue simvastatin. 4.  Hypertension: See above. 5.  Hyperlipidemia: We will check lipid panel, LFTs, LP(a) today.  His goal LDL is less than 55 given peripheral vascular disease and coronary artery calcification. 6.  Aortic atherosclerosis: See discussion above.     Dispo:  Return in about 1 year (around 12/26/2022).      Medication Adjustments/Labs and Tests Ordered: Current medicines are reviewed at length with the patient today.  Concerns regarding medicines are outlined above.   Tests Ordered: Orders Placed This Encounter  Procedures   Lipid panel   Lipoprotein A (LPA)   Hepatic function panel    Medication Changes: Meds ordered this encounter  Medications   metoprolol succinate (TOPROL XL) 25 MG 24 hr tablet    Sig: Take 0.5 tablets (12.5 mg total) by mouth at bedtime.    Dispense:  45 tablet    Refill:  3    History of Present Illness:    FOCUSED PROBLEM LIST:   1.  Abdominal aortic aneurysm status post endovascular repair pair May 2023  2.  Hyperlipidemia 3.  Hypertension 4.  Right bundle branch block 5.  Coronary calcification seen on CT scan  March 2023 consultation: The patient is a 76 y.o. male with the indicated  medical history here for preoperative cardiac evaluation prior to vascular surgical procedure.  Patient was seen by Dr. Myra Gianotti recently and a CT scan was performed which demonstrated that he had met the threshold for elective repair.  The patient is doing fairly well.  He is limited by knee pain in regards to mobility but is able to do most of his activities of daily living.  He denies any exertional angina, presyncope, syncope, or exertional dyspnea.  He has had no bleeding or bruising.  He has no abdominal pain.  He has had no back pain.  He is required no emergency room visits or hospitalizations.  Plan: Nuclear stress test, echocardiogram, and start aspirin 81 mg.  Today: The patient underwent uncomplicated endovascular repair of his AAA in May.  The patient is doing well.  He denies any cardiovascular symptomatology.  He has been tolerating his medications well.  He is required no recent emergency room visits or hospitalizations.  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    Current Medications: Current Meds  Medication Sig   amoxicillin (AMOXIL) 500 MG capsule Take 2,000 mg by mouth See admin instructions. Take 4 capsules (2000 mg) by mouth 1 hour prior to dental appointments.   aspirin EC 81 MG tablet Take 1 tablet (81 mg total) by mouth daily. Swallow whole.   calcium-vitamin D (OSCAL WITH D) 500-200 MG-UNIT tablet Take 1 tablet by  mouth in the morning.   losartan (COZAAR) 50 MG tablet Take 100 mg by mouth in the morning.   metoprolol succinate (TOPROL XL) 25 MG 24 hr tablet Take 0.5 tablets (12.5 mg total) by mouth at bedtime.   simvastatin (ZOCOR) 20 MG tablet Take 10 mg by mouth in the morning.     Allergies:    Patient has no known allergies.   Social History:   Social History   Tobacco Use   Smoking status: Former    Packs/day: 1.00    Years: 30.00    Total pack years: 30.00    Types: Cigarettes    Quit date: 03/30/1996     Years since quitting: 25.7   Smokeless tobacco: Never  Vaping Use   Vaping Use: Never used  Substance Use Topics   Alcohol use: Yes    Alcohol/week: 10.0 standard drinks of alcohol    Types: 10 Glasses of wine per week    Comment: occassional   Drug use: No     Family Hx: Family History  Problem Relation Age of Onset   Diabetes Mother    Cancer Father    Diabetes Sister    Cancer Paternal Aunt    Cancer Paternal Uncle    Cancer Paternal Aunt    Cancer Paternal Aunt    Cancer Paternal Aunt    Cancer Paternal Aunt    Cancer Paternal Uncle    Cancer Paternal Uncle    Cancer Paternal Uncle    Cancer Paternal Uncle    Anemia Neg Hx    Arrhythmia Neg Hx    Asthma Neg Hx    Clotting disorder Neg Hx    Fainting Neg Hx    Heart attack Neg Hx    Heart disease Neg Hx    Heart failure Neg Hx    Hyperlipidemia Neg Hx    Hypertension Neg Hx      Review of Systems:   Please see the history of present illness.    All other systems reviewed and are negative.     EKGs/Labs/Other Test Reviewed:    EKG: Sinus rhythm with right bundle branch block EKG 2013; today's EKG sinus rhythm with incomplete right bundle branch block  Prior CV studies: None available  Imaging studies that I have independently reviewed today:   TTE 2023:  1. Left ventricular ejection fraction, by estimation, is 60 to 65%. Left  ventricular ejection fraction by 3D volume is 61 %. The left ventricle has  normal function. The left ventricle has no regional wall motion  abnormalities. There is mild left  ventricular hypertrophy. Left ventricular diastolic parameters are  consistent with Grade I diastolic dysfunction (impaired relaxation). The  average left ventricular global longitudinal strain is -17.6 %. The global  longitudinal strain is mildly abnormal.   2. Right ventricular systolic function is normal. The right ventricular  size is normal. Tricuspid regurgitation signal is inadequate for assessing   PA pressure.   3. The mitral valve is grossly normal. Trivial mitral valve  regurgitation.   4. The aortic valve is tricuspid. Aortic valve regurgitation is not  visualized.   5. The inferior vena cava is normal in size with <50% respiratory  variability, suggesting right atrial pressure of 8 mmHg.   Lexiscan stress test 2023:   Findings are consistent with no ischemia. The study is low risk.   No ST deviation was noted.   LV perfusion is normal. There is no evidence of ischemia. There is  no evidence of infarction.   Left ventricular function is abnormal. Global function is mildly reduced. Nuclear stress EF: 48 %. The left ventricular ejection fraction is mildly decreased (45-54%). End diastolic cavity size is normal. End systolic cavity size is normal.   Prior study not available for comparison.  CT 2023 1. 5.6 cm infrarenal abdominal aortic aneurysm, previously 5.1 cm. Current guidelines recommend referral to a vascular specialist (the patient is currently under the care of a vascular specialist/surgeon). 2. 5.4 cm nonspecific right hepatic lesion, previously 3.6 cm in 2005. 2 smaller segment 2 lesions are also noted. Dedicated outpatient liver MR with contrast may be useful for further characterization. 3. Coronary and Aortic Atherosclerosis (ICD10-170.0). 4. Sigmoid diverticulosis  Recent Labs: 07/30/2021: Magnesium 2.2 08/02/2021: ALT 33; BUN 14; Creatinine, Ser 1.44; Hemoglobin 12.9; Platelets 165; Potassium 3.7; Sodium 132   Recent Lipid Panel No results found for: "CHOL", "TRIG", "HDL", "LDLCALC", "LDLDIRECT"  Risk Assessment/Calculations:           Physical Exam:    VS:  BP (!) 140/78   Pulse 65   Ht 5\' 9"  (1.753 m)   Wt 224 lb (101.6 kg)   SpO2 97%   BMI 33.08 kg/m    Wt Readings from Last 3 Encounters:  12/25/21 224 lb (101.6 kg)  09/08/21 220 lb (99.8 kg)  07/30/21 225 lb (102.1 kg)    GENERAL:  No apparent distress, AOx3 HEENT:  No carotid bruits, +2  carotid impulses, no scleral icterus CAR: RRR no murmurs, gallops, rubs, or thrills RES:  Clear to auscultation bilaterally ABD:  Soft, nontender, nondistended, positive bowel sounds x 4 VASC:  +2 radial pulses, +2 carotid pulses, palpable pedal pulses NEURO:  CN 2-12 grossly intact; motor and sensory grossly intact PSYCH:  No active depression or anxiety EXT:  No edema, ecchymosis, or cyanosis  Signed, Orbie Pyo, MD  12/25/2021 11:22 AM    [facility] [address] [place], [place], [place]  [address] Phone: [phone]; Fax: [phone]   Note:  This document was prepared using Dragon voice recognition software and may include unintentional dictation errors.

## 2021-12-25 ENCOUNTER — Ambulatory Visit: Payer: Medicare HMO | Attending: Internal Medicine | Admitting: Internal Medicine

## 2021-12-25 ENCOUNTER — Encounter: Payer: Self-pay | Admitting: Internal Medicine

## 2021-12-25 VITALS — BP 140/78 | HR 65 | Ht 69.0 in | Wt 224.0 lb

## 2021-12-25 DIAGNOSIS — I714 Abdominal aortic aneurysm, without rupture, unspecified: Secondary | ICD-10-CM | POA: Diagnosis not present

## 2021-12-25 DIAGNOSIS — E785 Hyperlipidemia, unspecified: Secondary | ICD-10-CM

## 2021-12-25 DIAGNOSIS — I2510 Atherosclerotic heart disease of native coronary artery without angina pectoris: Secondary | ICD-10-CM | POA: Diagnosis not present

## 2021-12-25 DIAGNOSIS — I700 Atherosclerosis of aorta: Secondary | ICD-10-CM | POA: Diagnosis not present

## 2021-12-25 DIAGNOSIS — I10 Essential (primary) hypertension: Secondary | ICD-10-CM | POA: Diagnosis not present

## 2021-12-25 MED ORDER — METOPROLOL SUCCINATE ER 25 MG PO TB24: 12.5000 mg | ORAL_TABLET | Freq: Every day | ORAL | 3 refills | Status: DC

## 2021-12-25 NOTE — Patient Instructions (Signed)
Medication Instructions:  Your physician has recommended you make the following change in your medication:  1.) start metoprolol succinate 25 mg (Toprol XL) --take HALF TABLET -12.5 mg, daily at bedtime  *If you need a refill on your cardiac medications before your next appointment, please call your pharmacy*   Lab Work: Today: lipids/liver/Lp(a) If you have labs (blood work) drawn today and your tests are completely normal, you will receive your results only by: [place] (if you have MyChart) OR A paper copy in the mail If you have any lab test that is abnormal or we need to change your treatment, we will call you to review the results.   Testing/Procedures: none   Follow-Up: At [facility], you and your health needs are our priority.  As part of our continuing mission to provide you with exceptional heart care, we have created designated Provider Care Teams.  These Care Teams include your primary Cardiologist (physician) and Advanced Practice Providers (APPs -  Physician Assistants and Nurse Practitioners) who all work together to provide you with the care you need, when you need it.   Your next appointment:   12 month(s)  The format for your next appointment:   In Person  Provider:   Early Osmond, MD     Important Information About Sugar

## 2021-12-26 LAB — HEPATIC FUNCTION PANEL
ALT: 11 IU/L (ref 0–44)
AST: 10 IU/L (ref 0–40)
Albumin: 4.3 g/dL (ref 3.8–4.8)
Alkaline Phosphatase: 101 IU/L (ref 44–121)
Bilirubin Total: 0.7 mg/dL (ref 0.0–1.2)
Bilirubin, Direct: 0.18 mg/dL (ref 0.00–0.40)
Total Protein: 5.9 g/dL — ABNORMAL LOW (ref 6.0–8.5)

## 2021-12-26 LAB — LIPID PANEL
Chol/HDL Ratio: 4 ratio (ref 0.0–5.0)
Cholesterol, Total: 158 mg/dL (ref 100–199)
HDL: 40 mg/dL (ref >39)
LDL Chol Calc (NIH): 78 mg/dL (ref 0–99)
Triglycerides: 243 mg/dL — ABNORMAL HIGH (ref 0–149)
VLDL Cholesterol Cal: 40 mg/dL (ref 5–40)

## 2021-12-26 LAB — LIPOPROTEIN A (LPA): Lipoprotein (a): <8.4 nmol/L (ref <75.0)

## 2021-12-29 DIAGNOSIS — E785 Hyperlipidemia, unspecified: Secondary | ICD-10-CM | POA: Diagnosis not present

## 2021-12-29 DIAGNOSIS — N1831 Chronic kidney disease, stage 3a: Secondary | ICD-10-CM | POA: Diagnosis not present

## 2022-01-05 DIAGNOSIS — Z8679 Personal history of other diseases of the circulatory system: Secondary | ICD-10-CM | POA: Diagnosis not present

## 2022-01-05 DIAGNOSIS — I10 Essential (primary) hypertension: Secondary | ICD-10-CM | POA: Diagnosis not present

## 2022-01-05 DIAGNOSIS — E785 Hyperlipidemia, unspecified: Secondary | ICD-10-CM | POA: Diagnosis not present

## 2022-01-05 DIAGNOSIS — Z0000 Encounter for general adult medical examination without abnormal findings: Secondary | ICD-10-CM | POA: Diagnosis not present

## 2022-01-05 DIAGNOSIS — E669 Obesity, unspecified: Secondary | ICD-10-CM | POA: Diagnosis not present

## 2022-01-05 DIAGNOSIS — Z6833 Body mass index (BMI) 33.0-33.9, adult: Secondary | ICD-10-CM | POA: Diagnosis not present

## 2022-01-05 DIAGNOSIS — D692 Other nonthrombocytopenic purpura: Secondary | ICD-10-CM | POA: Diagnosis not present

## 2022-05-12 DIAGNOSIS — Z8546 Personal history of malignant neoplasm of prostate: Secondary | ICD-10-CM | POA: Diagnosis not present

## 2022-05-12 DIAGNOSIS — C61 Malignant neoplasm of prostate: Secondary | ICD-10-CM | POA: Diagnosis not present

## 2022-05-12 DIAGNOSIS — N5201 Erectile dysfunction due to arterial insufficiency: Secondary | ICD-10-CM | POA: Diagnosis not present

## 2022-06-08 ENCOUNTER — Ambulatory Visit: Payer: Medicare HMO | Admitting: Physician Assistant

## 2022-06-08 ENCOUNTER — Ambulatory Visit (HOSPITAL_COMMUNITY)
Admission: RE | Admit: 2022-06-08 | Discharge: 2022-06-08 | Disposition: A | Discharge status: Home / Self Care | Payer: Medicare HMO | Source: Ambulatory Visit | Attending: Surgery | Admitting: Surgery

## 2022-06-08 VITALS — BP 183/84 | HR 53 | Temp 97.3°F | Resp 16 | Ht 69.0 in | Wt 224.0 lb

## 2022-06-08 DIAGNOSIS — I714 Abdominal aortic aneurysm, without rupture, unspecified: Secondary | ICD-10-CM | POA: Diagnosis not present

## 2022-06-08 DIAGNOSIS — I728 Aneurysm of other specified arteries: Secondary | ICD-10-CM

## 2022-06-08 DIAGNOSIS — I7143 Infrarenal abdominal aortic aneurysm, without rupture: Secondary | ICD-10-CM | POA: Diagnosis not present

## 2022-06-08 DIAGNOSIS — E119 Type 2 diabetes mellitus without complications: Secondary | ICD-10-CM | POA: Diagnosis not present

## 2022-06-08 NOTE — Progress Notes (Addendum)
Office Note     CC:  follow up Requesting Provider:  Kathyrn Lass, MD  HPI: Scott Hill is a 77 y.o. (22-May-1945) male who presents for surveillance of AAA.  He underwent endovascular repair by Dr. Trula Slade in May 2023.  He denies any new or changing abdominal or back pain.  He has chronic lower back pain however this is unchanged.  He denies any claudication, rest pain, or tissue loss of bilateral lower extremities.  He has a history of prostate cancer and underwent radiation.  He is a former smoker.  He is on a daily aspirin and statin.   Past Medical History:  Diagnosis Date   AAA (abdominal aortic aneurysm) (HCC)    Arthritis    GERD (gastroesophageal reflux disease)    occasionally   Hyperlipemia    Hypertension    Kidney stones    Prostate cancer [facility])     Past Surgical History:  Procedure Laterality Date   ABDOMINAL AORTIC ENDOVASCULAR STENT GRAFT N/A 07/30/2021   Procedure: ABDOMINAL AORTIC ENDOVASCULAR STENT GRAFT (GORE);  Surgeon: Serafina Mitchell, MD;  Location: [facility] OR;  Service: Vascular;  Laterality: N/A;   CHOLECYSTECTOMY     fatty tumors     begein,numerous   KNEE SURGERY     cartledge removed   TOTAL KNEE ARTHROPLASTY  11/24/2011   left knee   TOTAL KNEE ARTHROPLASTY  11/24/2011   Procedure: TOTAL KNEE ARTHROPLASTY;  Surgeon: Hessie Dibble, MD;  Location: [facility];  Service: Orthopedics;  Laterality: Left;  with revision tibia   ULTRASOUND GUIDANCE FOR VASCULAR ACCESS Bilateral 07/30/2021   Procedure: ULTRASOUND GUIDANCE FOR VASCULAR ACCESS, BILATERAL FEMORAL ARTERIES;  Surgeon: Serafina Mitchell, MD;  Location: [facility] OR;  Service: Vascular;  Laterality: Bilateral;    Social History   Socioeconomic History   Marital status: Married    Spouse name: Not on file   Number of children: 0   Years of education: Not on file   Highest education level: Not on file  Occupational History    Comment: retired  Tobacco Use   Smoking status: Former    Packs/day: 1.00     Years: 30.00    Total pack years: 30.00    Types: Cigarettes    Quit date: 03/30/1996    Years since quitting: 26.2   Smokeless tobacco: Never  Vaping Use   Vaping Use: Never used  Substance and Sexual Activity   Alcohol use: Yes    Alcohol/week: 10.0 standard drinks of alcohol    Types: 10 Glasses of wine per week    Comment: occassional   Drug use: No   Sexual activity: Not Currently  Other Topics Concern   Not on file  Social History Narrative   Married 45 years (2020).   Social Determinants of Health   Financial Resource Strain: Not on file  Food Insecurity: Not on file  Transportation Needs: Not on file  Physical Activity: Not on file  Stress: Not on file  Social Connections: Not on file  Intimate Partner Violence: Not on file    Family History  Problem Relation Age of Onset   Diabetes Mother    Cancer Father    Diabetes Sister    Cancer Paternal Aunt    Cancer Paternal Uncle    Cancer Paternal Aunt    Cancer Paternal Aunt    Cancer Paternal Aunt    Cancer Paternal Aunt    Cancer Paternal Uncle    Cancer Paternal Uncle  Cancer Paternal Uncle    Cancer Paternal Uncle    Anemia Neg Hx    Arrhythmia Neg Hx    Asthma Neg Hx    Clotting disorder Neg Hx    Fainting Neg Hx    Heart attack Neg Hx    Heart disease Neg Hx    Heart failure Neg Hx    Hyperlipidemia Neg Hx    Hypertension Neg Hx     Current Outpatient Medications  Medication Sig Dispense Refill   aspirin EC 81 MG tablet Take 1 tablet (81 mg total) by mouth daily. Swallow whole. 90 tablet 3   calcium-vitamin D (OSCAL WITH D) 500-200 MG-UNIT tablet Take 1 tablet by mouth in the morning.     losartan (COZAAR) 50 MG tablet Take 100 mg by mouth in the morning.     metoprolol succinate (TOPROL XL) 25 MG 24 hr tablet Take 0.5 tablets (12.5 mg total) by mouth at bedtime. 45 tablet 3   simvastatin (ZOCOR) 20 MG tablet Take 10 mg by mouth in the morning.     amoxicillin (AMOXIL) 500 MG capsule Take  2,000 mg by mouth See admin instructions. Take 4 capsules (2000 mg) by mouth 1 hour prior to dental appointments. (Patient not taking: Reported on 06/08/2022)     No current facility-administered medications for this visit.    No Known Allergies   REVIEW OF SYSTEMS:   '[X]'$  denotes positive finding, '[ ]'$  denotes negative finding Cardiac  Comments:  Chest pain or chest pressure:    Shortness of breath upon exertion:    Short of breath when lying flat:    Irregular heart rhythm:        Vascular    Pain in calf, thigh, or hip brought on by ambulation:    Pain in feet at night that wakes you up from your sleep:     Blood clot in your veins:    Leg swelling:         Pulmonary    Oxygen at home:    Productive cough:     Wheezing:         Neurologic    Sudden weakness in arms or legs:     Sudden numbness in arms or legs:     Sudden onset of difficulty speaking or slurred speech:    Temporary loss of vision in one eye:     Problems with dizziness:         Gastrointestinal    Blood in stool:     Vomited blood:         Genitourinary    Burning when urinating:     Blood in urine:        Psychiatric    Major depression:         Hematologic    Bleeding problems:    Problems with blood clotting too easily:        Skin    Rashes or ulcers:        Constitutional    Fever or chills:      PHYSICAL EXAMINATION:  Vitals:   06/08/22 0931 06/08/22 0934  BP:  (!) 183/84  Pulse:  (!) 53  Resp: 16   Temp:  (!) 97.3 F (36.3 C)  TempSrc:  Temporal  Weight: 224 lb (101.6 kg)   Height: '5\' 9"'$  (1.753 m)     General:  WDWN in NAD; vital signs documented above Gait: Not observed HENT: WNL, normocephalic Pulmonary: normal non-labored breathing ,  without Rales, rhonchi,  wheezing Cardiac: regular HR Abdomen: soft, NT, no masses Skin: without rashes Vascular Exam/Pulses:  Right Left  DP 2+ (normal) 2+ (normal)  L PT also palpable Extremities: without ischemic changes,  without Gangrene , without cellulitis; without open wounds;  Musculoskeletal: no muscle wasting or atrophy  Neurologic: A&O X 3;  No focal weakness or paresthesias are detected Psychiatric:  The pt has Normal affect.   Non-Invasive Vascular Imaging:   EVAR duplex demonstrates 5.2 cm AAA sac with no endoleak    ASSESSMENT/PLAN:: 77 y.o. male status post endovascular repair of AAA by Dr. Trula Slade in May 2023  -AAA sac has decreased in size slightly from 5.6-5.25 by duplex today.  No endoleak noted.  Recheck duplex in 1 year per protocol.  Continue aspirin and statin daily.  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   Dagoberto Ligas, PA-C [facility] [phone]  Clinic MD:   Trula Slade

## 2022-06-29 DIAGNOSIS — I10 Essential (primary) hypertension: Secondary | ICD-10-CM | POA: Diagnosis not present

## 2022-06-29 DIAGNOSIS — G4719 Other hypersomnia: Secondary | ICD-10-CM | POA: Diagnosis not present

## 2022-07-13 DIAGNOSIS — D692 Other nonthrombocytopenic purpura: Secondary | ICD-10-CM | POA: Diagnosis not present

## 2022-07-13 DIAGNOSIS — E669 Obesity, unspecified: Secondary | ICD-10-CM | POA: Diagnosis not present

## 2022-07-13 DIAGNOSIS — N1831 Chronic kidney disease, stage 3a: Secondary | ICD-10-CM | POA: Diagnosis not present

## 2022-07-13 DIAGNOSIS — I129 Hypertensive chronic kidney disease with stage 1 through stage 4 chronic kidney disease, or unspecified chronic kidney disease: Secondary | ICD-10-CM | POA: Diagnosis not present

## 2022-07-13 DIAGNOSIS — I728 Aneurysm of other specified arteries: Secondary | ICD-10-CM | POA: Diagnosis not present

## 2022-07-13 DIAGNOSIS — I700 Atherosclerosis of aorta: Secondary | ICD-10-CM | POA: Diagnosis not present

## 2022-07-13 DIAGNOSIS — Z8679 Personal history of other diseases of the circulatory system: Secondary | ICD-10-CM | POA: Diagnosis not present

## 2022-07-13 DIAGNOSIS — J439 Emphysema, unspecified: Secondary | ICD-10-CM | POA: Diagnosis not present

## 2022-07-13 DIAGNOSIS — E7800 Pure hypercholesterolemia, unspecified: Secondary | ICD-10-CM | POA: Diagnosis not present

## 2022-07-27 DIAGNOSIS — I10 Essential (primary) hypertension: Secondary | ICD-10-CM | POA: Diagnosis not present

## 2022-07-27 DIAGNOSIS — G4733 Obstructive sleep apnea (adult) (pediatric): Secondary | ICD-10-CM | POA: Diagnosis not present

## 2022-10-27 DIAGNOSIS — G4733 Obstructive sleep apnea (adult) (pediatric): Secondary | ICD-10-CM | POA: Diagnosis not present

## 2022-10-27 DIAGNOSIS — I10 Essential (primary) hypertension: Secondary | ICD-10-CM | POA: Diagnosis not present

## 2023-01-01 NOTE — Progress Notes (Unsigned)
Cardiology Office Note:   Date:  01/04/2023  ID:  Scott Hill, DOB [birth year], MRN [id number] PCP:  Sigmund Hazel, MD  [facility] Providers Cardiologist:  Alverda Skeans, MD Referring MD: Sigmund Hazel, MD   Chief Complaint/Reason for Referral: Cardiology follow-up ASSESSMENT:    1. Status post endovascular aneurysm repair (EVAR)   2. Coronary artery calcification seen on CAT scan   3. Primary hypertension   4. Aortic atherosclerosis (HCC)   5. Hyperlipidemia LDL goal <55     PLAN:   In order of problems listed above: 1.  Status post endovascular repair: Followed by vascular surgery; continue to aggressively control cardiovascular risk factors. 2.  Coronary artery disease: Continue aspirin and statin, and strict blood pressure control. 3.  Hypertension: Blood pressure is well-controlled on his current regimen. 4.  Aortic atherosclerosis:  See number 5 below. 5.  Hyperlipidemia: Given presence of peripheral vascular disease, goal LDL is less than 55.  Will check lipid panel and LFTs today.             Dispo:  Return in about 6 months (around 07/05/2023).      Medication Adjustments/Labs and Tests Ordered: Current medicines are reviewed at length with the patient today.  Concerns regarding medicines are outlined above.  The following changes have been made:  no change   Labs/tests ordered: Orders Placed This Encounter  Procedures   Lipid panel   Hepatic function panel   EKG 12-Lead    Medication Changes: No orders of the defined types were placed in this encounter.   Current medicines are reviewed at length with the patient today.  The patient does not have concerns regarding medicines.  History of Present Illness:      FOCUSED PROBLEM LIST:   Abdominal aneurysm status post endovascular repair May 2023 Coronary artery calcification Hypertension Hyperlipidemia; low LP(a) Aortic atherosclerosis Right bundle branch block  March 2023 consultation: The  patient is a 77 y.o. male with the indicated medical history here for preoperative cardiac evaluation prior to vascular surgical procedure.  Patient was seen by Dr. Myra Gianotti recently and a CT scan was performed which demonstrated that he had met the threshold for elective repair.   The patient is doing fairly well.  He is limited by knee pain in regards to mobility but is able to do most of his activities of daily living.  He denies any exertional angina, presyncope, syncope, or exertional dyspnea.  He has had no bleeding or bruising.  He has no abdominal pain.  He has had no back pain.  He is required no emergency room visits or hospitalizations.  Plan: Nuclear stress test, echocardiogram, and start aspirin 81 mg.   September 2023: The patient underwent uncomplicated endovascular repair of his AAA in May.  The patient is doing well.  He denies any cardiovascular symptomatology.  He has been tolerating his medications well.  He is required no recent emergency room visits or hospitalizations.  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  Plan: Start Toprol-XL 12.5 mg at bedtime, check lipids, LFTs, LP(a).  October 2024: His LDL was found to be 78 and his LP(a) was low a year ago.  He is doing well.  He has had no issues.  He said no problems with his endovascular repair.  He plans on seeing vascular surgery because he does have a celiac aneurysm may need to be managed.  He has had no cardiovascular issues.  He is  tolerating his medical therapy well without problems.          Current Medications: Current Meds  Medication Sig   acetaminophen (TYLENOL) 325 MG tablet Take by mouth.   amoxicillin (AMOXIL) 500 MG capsule Take 2,000 mg by mouth See admin instructions. Take 4 capsules (2000 mg) by mouth 1 hour prior to dental appointments.   aspirin EC 81 MG tablet Take 1 tablet (81 mg total) by mouth daily. Swallow whole.   calcium-vitamin D (OSCAL WITH D) 500-200  MG-UNIT tablet Take 1 tablet by mouth in the morning.   losartan (COZAAR) 50 MG tablet Take 100 mg by mouth in the morning.   metoprolol succinate (TOPROL-XL) 50 MG 24 hr tablet Take 50 mg by mouth daily.   simvastatin (ZOCOR) 20 MG tablet Take 10 mg by mouth in the morning.      Review of Systems:   Please see the history of present illness.    All other systems reviewed and are negative.      EKGs/Labs/Other Test Reviewed:   EKG: EKG performed May 2023 that I personally reviewed demonstrates sinus rhythm with right bundle branch block  EKG Interpretation Date/Time:    Ventricular Rate:    PR Interval:    QRS Duration:    QT Interval:    QTC Calculation:   R Axis:      Text Interpretation:           Risk Assessment/Calculations:          Physical Exam:   VS:  BP 125/75   Pulse (!) 59   Ht 5\' 8"  (1.727 m)   Wt 220 lb 3.2 oz (99.9 kg)   SpO2 95%   BMI 33.48 kg/m        Wt Readings from Last 3 Encounters:  01/04/23 220 lb 3.2 oz (99.9 kg)  06/08/22 224 lb (101.6 kg)  12/25/21 224 lb (101.6 kg)      GENERAL:  No apparent distress, AOx3 HEENT:  No carotid bruits, +2 carotid impulses, no scleral icterus CAR: RRR no murmurs, gallops, rubs, or thrills RES:  Clear to auscultation bilaterally ABD:  Soft, nontender, nondistended, positive bowel sounds x 4 VASC:  +2 radial pulses, +2 carotid pulses NEURO:  CN 2-12 grossly intact; motor and sensory grossly intact PSYCH:  No active depression or anxiety EXT:  No edema, ecchymosis, or cyanosis  Signed, Orbie Pyo, MD  01/04/2023 3:26 PM    [facility] [address] [place], [place], [place]  [address] Phone: [phone]; Fax: [phone]   Note:  This document was prepared using Dragon voice recognition software and may include unintentional dictation errors.

## 2023-01-04 ENCOUNTER — Ambulatory Visit: Payer: Medicare HMO | Attending: Internal Medicine | Admitting: Internal Medicine

## 2023-01-04 ENCOUNTER — Encounter: Payer: Self-pay | Admitting: Internal Medicine

## 2023-01-04 VITALS — BP 125/75 | HR 59 | Ht 68.0 in | Wt 220.2 lb

## 2023-01-04 DIAGNOSIS — Z9889 Other specified postprocedural states: Secondary | ICD-10-CM | POA: Diagnosis not present

## 2023-01-04 DIAGNOSIS — I10 Essential (primary) hypertension: Secondary | ICD-10-CM

## 2023-01-04 DIAGNOSIS — Z8679 Personal history of other diseases of the circulatory system: Secondary | ICD-10-CM | POA: Diagnosis not present

## 2023-01-04 DIAGNOSIS — I700 Atherosclerosis of aorta: Secondary | ICD-10-CM

## 2023-01-04 DIAGNOSIS — E785 Hyperlipidemia, unspecified: Secondary | ICD-10-CM | POA: Diagnosis not present

## 2023-01-04 DIAGNOSIS — I4510 Unspecified right bundle-branch block: Secondary | ICD-10-CM

## 2023-01-04 DIAGNOSIS — Z95828 Presence of other vascular implants and grafts: Secondary | ICD-10-CM

## 2023-01-04 DIAGNOSIS — I2510 Atherosclerotic heart disease of native coronary artery without angina pectoris: Secondary | ICD-10-CM

## 2023-01-04 NOTE — Patient Instructions (Signed)
Medication Instructions:  Your physician recommends that you continue on your current medications as directed. Please refer to the Current Medication list given to you today.  *If you need a refill on your cardiac medications before your next appointment, please call your pharmacy*  Lab Work: TODAY: Lipid panel, LFTs If you have labs (blood work) drawn today and your tests are completely normal, you will receive your results only by: MyChart Message (if you have MyChart) OR A paper copy in the mail If you have any lab test that is abnormal or we need to change your treatment, we will call you to review the results.  Testing/Procedures: None ordered today.  Follow-Up: At [facility], you and your health needs are our priority.  As part of our continuing mission to provide you with exceptional heart care, we have created designated Provider Care Teams.  These Care Teams include your primary Cardiologist (physician) and Advanced Practice Providers (APPs -  Physician Assistants and Nurse Practitioners) who all work together to provide you with the care you need, when you need it.  We recommend signing up for the patient portal called "MyChart".  Sign up information is provided on this After Visit Summary.  MyChart is used to connect with patients for Virtual Visits (Telemedicine).  Patients are able to view lab/test results, encounter notes, upcoming appointments, etc.  Non-urgent messages can be sent to your provider as well.   To learn more about what you can do with MyChart, go to [email or web address].    Your next appointment:   6 month(s)  The format for your next appointment:   In Person  Provider:   Jari Favre, PA-C, Ronie Spies, PA-C, Robin Searing, NP, Tereso Newcomer, PA-C, or Perlie Gold, PA-C

## 2023-01-05 LAB — HEPATIC FUNCTION PANEL
ALT: 14 [IU]/L (ref 0–44)
AST: 11 [IU]/L (ref 0–40)
Albumin: 4.1 g/dL (ref 3.8–4.8)
Alkaline Phosphatase: 97 [IU]/L (ref 44–121)
Bilirubin Total: 0.6 mg/dL (ref 0.0–1.2)
Bilirubin, Direct: 0.15 mg/dL (ref 0.00–0.40)
Total Protein: 5.8 g/dL — ABNORMAL LOW (ref 6.0–8.5)

## 2023-01-05 LAB — LIPID PANEL
Chol/HDL Ratio: 4.7 {ratio} (ref 0.0–5.0)
Cholesterol, Total: 165 mg/dL (ref 100–199)
HDL: 35 mg/dL — ABNORMAL LOW (ref >39)
LDL Chol Calc (NIH): 83 mg/dL (ref 0–99)
Triglycerides: 282 mg/dL — ABNORMAL HIGH (ref 0–149)
VLDL Cholesterol Cal: 47 mg/dL — ABNORMAL HIGH (ref 5–40)

## 2023-01-07 ENCOUNTER — Telehealth: Payer: Self-pay | Admitting: *Deleted

## 2023-01-07 DIAGNOSIS — I700 Atherosclerosis of aorta: Secondary | ICD-10-CM

## 2023-01-07 DIAGNOSIS — E785 Hyperlipidemia, unspecified: Secondary | ICD-10-CM

## 2023-01-07 MED ORDER — SIMVASTATIN 40 MG PO TABS: 40.0000 mg | ORAL_TABLET | Freq: Every day | ORAL | 3 refills | Status: DC

## 2023-01-07 NOTE — Telephone Encounter (Signed)
-----   Message from Orbie Pyo sent at 01/05/2023  7:06 AM EDT ----- Increase simvastatin to 40 mg and check lipid panel LFTs in 2 months

## 2023-01-11 DIAGNOSIS — E785 Hyperlipidemia, unspecified: Secondary | ICD-10-CM | POA: Diagnosis not present

## 2023-01-11 DIAGNOSIS — N1831 Chronic kidney disease, stage 3a: Secondary | ICD-10-CM | POA: Diagnosis not present

## 2023-01-18 DIAGNOSIS — I129 Hypertensive chronic kidney disease with stage 1 through stage 4 chronic kidney disease, or unspecified chronic kidney disease: Secondary | ICD-10-CM | POA: Diagnosis not present

## 2023-01-18 DIAGNOSIS — Z6833 Body mass index (BMI) 33.0-33.9, adult: Secondary | ICD-10-CM | POA: Diagnosis not present

## 2023-01-18 DIAGNOSIS — E785 Hyperlipidemia, unspecified: Secondary | ICD-10-CM | POA: Diagnosis not present

## 2023-01-18 DIAGNOSIS — Z8546 Personal history of malignant neoplasm of prostate: Secondary | ICD-10-CM | POA: Diagnosis not present

## 2023-01-18 DIAGNOSIS — G4733 Obstructive sleep apnea (adult) (pediatric): Secondary | ICD-10-CM | POA: Diagnosis not present

## 2023-01-18 DIAGNOSIS — N1831 Chronic kidney disease, stage 3a: Secondary | ICD-10-CM | POA: Diagnosis not present

## 2023-01-18 DIAGNOSIS — Z0000 Encounter for general adult medical examination without abnormal findings: Secondary | ICD-10-CM | POA: Diagnosis not present

## 2023-01-18 DIAGNOSIS — I700 Atherosclerosis of aorta: Secondary | ICD-10-CM | POA: Diagnosis not present

## 2023-01-18 DIAGNOSIS — Z9989 Dependence on other enabling machines and devices: Secondary | ICD-10-CM | POA: Diagnosis not present

## 2023-03-10 ENCOUNTER — Other Ambulatory Visit: Payer: Medicare HMO

## 2023-03-10 DIAGNOSIS — I700 Atherosclerosis of aorta: Secondary | ICD-10-CM

## 2023-03-10 DIAGNOSIS — E785 Hyperlipidemia, unspecified: Secondary | ICD-10-CM

## 2023-04-27 DIAGNOSIS — G4733 Obstructive sleep apnea (adult) (pediatric): Secondary | ICD-10-CM | POA: Diagnosis not present

## 2023-04-27 DIAGNOSIS — G4719 Other hypersomnia: Secondary | ICD-10-CM | POA: Diagnosis not present

## 2023-05-10 DIAGNOSIS — C61 Malignant neoplasm of prostate: Secondary | ICD-10-CM | POA: Diagnosis not present

## 2023-05-10 DIAGNOSIS — Z8546 Personal history of malignant neoplasm of prostate: Secondary | ICD-10-CM | POA: Diagnosis not present

## 2023-05-17 DIAGNOSIS — R351 Nocturia: Secondary | ICD-10-CM | POA: Diagnosis not present

## 2023-05-17 DIAGNOSIS — Z8546 Personal history of malignant neoplasm of prostate: Secondary | ICD-10-CM | POA: Diagnosis not present

## 2023-05-26 DIAGNOSIS — R5383 Other fatigue: Secondary | ICD-10-CM | POA: Diagnosis not present

## 2023-05-26 DIAGNOSIS — B349 Viral infection, unspecified: Secondary | ICD-10-CM | POA: Diagnosis not present

## 2023-05-26 DIAGNOSIS — R0981 Nasal congestion: Secondary | ICD-10-CM | POA: Diagnosis not present

## 2023-06-09 ENCOUNTER — Other Ambulatory Visit: Payer: Self-pay | Admitting: *Deleted

## 2023-06-09 DIAGNOSIS — Z9889 Other specified postprocedural states: Secondary | ICD-10-CM

## 2023-06-14 ENCOUNTER — Ambulatory Visit (HOSPITAL_COMMUNITY)
Admission: RE | Admit: 2023-06-14 | Discharge: 2023-06-14 | Disposition: A | Discharge status: Home / Self Care | Payer: Medicare HMO | Source: Ambulatory Visit | Attending: Surgery | Admitting: Surgery

## 2023-06-14 ENCOUNTER — Ambulatory Visit: Payer: Medicare HMO | Admitting: Physician Assistant

## 2023-06-14 VITALS — BP 193/99 | HR 48 | Temp 97.7°F | Resp 97 | Ht 68.0 in | Wt 221.0 lb

## 2023-06-14 DIAGNOSIS — I7143 Infrarenal abdominal aortic aneurysm, without rupture: Secondary | ICD-10-CM | POA: Diagnosis not present

## 2023-06-14 DIAGNOSIS — I728 Aneurysm of other specified arteries: Secondary | ICD-10-CM | POA: Diagnosis not present

## 2023-06-14 DIAGNOSIS — Z9889 Other specified postprocedural states: Secondary | ICD-10-CM | POA: Insufficient documentation

## 2023-06-14 NOTE — Progress Notes (Signed)
 Office Note     CC:  follow up Requesting Provider:  Sigmund Hazel, MD  HPI: Scott Hill is a 78 y.o. (04-26-1945) male who presents for routine follow up of AAA s/p EVAR in May of 2023 by Dr. Myra Gianotti. 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 Has been stable on duplex evaluation.   Today he reports overall doing well although says he is very tired. Denies any new abdominal pain or back pain. Does have some chronic low back pain that is unchanged. He denies any pain in his legs on ambulation or rest. No tissue loss. He is medically managed on Aspirin and statin.   Past Medical History:  Diagnosis Date   AAA (abdominal aortic aneurysm) (HCC)    Arthritis    GERD (gastroesophageal reflux disease)    occasionally   Hyperlipemia    Hypertension    Kidney stones    Prostate cancer [facility])     Past Surgical History:  Procedure Laterality Date   ABDOMINAL AORTIC ENDOVASCULAR STENT GRAFT N/A 07/30/2021   Procedure: ABDOMINAL AORTIC ENDOVASCULAR STENT GRAFT (GORE);  Surgeon: Nada Libman, MD;  Location: [facility] OR;  Service: Vascular;  Laterality: N/A;   CHOLECYSTECTOMY     fatty tumors     begein,numerous   KNEE SURGERY     cartledge removed   TOTAL KNEE ARTHROPLASTY  11/24/2011   left knee   TOTAL KNEE ARTHROPLASTY  11/24/2011   Procedure: TOTAL KNEE ARTHROPLASTY;  Surgeon: Velna Ochs, MD;  Location: [facility] OR;  Service: Orthopedics;  Laterality: Left;  with revision tibia   ULTRASOUND GUIDANCE FOR VASCULAR ACCESS Bilateral 07/30/2021   Procedure: ULTRASOUND GUIDANCE FOR VASCULAR ACCESS, BILATERAL FEMORAL ARTERIES;  Surgeon: Nada Libman, MD;  Location: [facility] OR;  Service: Vascular;  Laterality: Bilateral;    Social History   Socioeconomic History   Marital status: Married    Spouse name: Not on file   Number of children: 0   Years of education: Not on file   Highest education level: Not on file  Occupational History    Comment: retired  Tobacco Use   Smoking  status: Former    Current packs/day: 0.00    Average packs/day: 1 pack/day for 30.0 years (30.0 ttl pk-yrs)    Types: Cigarettes    Start date: 03/30/1966    Quit date: 03/30/1996    Years since quitting: 27.2   Smokeless tobacco: Never  Vaping Use   Vaping status: Never Used  Substance and Sexual Activity   Alcohol use: Yes    Alcohol/week: 10.0 standard drinks of alcohol    Types: 10 Glasses of wine per week    Comment: occassional   Drug use: No   Sexual activity: Not Currently  Other Topics Concern   Not on file  Social History Narrative   Married 45 years (2020).   Social Drivers of Corporate investment banker Strain: Not on file  Food Insecurity: Not on file  Transportation Needs: Not on file  Physical Activity: Not on file  Stress: Not on file  Social Connections: Not on file  Intimate Partner Violence: Not on file    Family History  Problem Relation Age of Onset   Diabetes Mother    Cancer Father    Diabetes Sister    Cancer Paternal Aunt    Cancer Paternal Uncle    Cancer Paternal Aunt    Cancer Paternal Aunt    Cancer Paternal Aunt  Cancer Paternal Aunt    Cancer Paternal Uncle    Cancer Paternal Uncle    Cancer Paternal Uncle    Cancer Paternal Uncle    Anemia Neg Hx    Arrhythmia Neg Hx    Asthma Neg Hx    Clotting disorder Neg Hx    Fainting Neg Hx    Heart attack Neg Hx    Heart disease Neg Hx    Heart failure Neg Hx    Hyperlipidemia Neg Hx    Hypertension Neg Hx     Current Outpatient Medications  Medication Sig Dispense Refill   acetaminophen (TYLENOL) 325 MG tablet Take by mouth.     amoxicillin (AMOXIL) 500 MG capsule Take 2,000 mg by mouth See admin instructions. Take 4 capsules (2000 mg) by mouth 1 hour prior to dental appointments.     aspirin EC 81 MG tablet Take 1 tablet (81 mg total) by mouth daily. Swallow whole. 90 tablet 3   calcium-vitamin D (OSCAL WITH D) 500-200 MG-UNIT tablet Take 1 tablet by mouth in the morning.      losartan (COZAAR) 50 MG tablet Take 100 mg by mouth in the morning.     metoprolol succinate (TOPROL-XL) 50 MG 24 hr tablet Take 50 mg by mouth daily.     simvastatin (ZOCOR) 40 MG tablet Take 1 tablet (40 mg total) by mouth at bedtime. 90 tablet 3   No current facility-administered medications for this visit.    No Known Allergies   REVIEW OF SYSTEMS:  [X]  denotes positive finding, [ ]  denotes negative finding Cardiac  Comments:  Chest pain or chest pressure:    Shortness of breath upon exertion:    Short of breath when lying flat:    Irregular heart rhythm:        Vascular    Pain in calf, thigh, or hip brought on by ambulation:    Pain in feet at night that wakes you up from your sleep:     Blood clot in your veins:    Leg swelling:         Pulmonary    Oxygen at home:    Productive cough:     Wheezing:         Neurologic    Sudden weakness in arms or legs:     Sudden numbness in arms or legs:     Sudden onset of difficulty speaking or slurred speech:    Temporary loss of vision in one eye:     Problems with dizziness:         Gastrointestinal    Blood in stool:     Vomited blood:         Genitourinary    Burning when urinating:     Blood in urine:        Psychiatric    Major depression:         Hematologic    Bleeding problems:    Problems with blood clotting too easily:        Skin    Rashes or ulcers:        Constitutional    Fever or chills:      PHYSICAL EXAMINATION:  Vitals:   06/14/23 1012  BP: (!) 193/99  Pulse: (!) 48  Resp: (!) 97  Temp: 97.7 F (36.5 C)  TempSrc: Temporal  Weight: 221 lb (100.2 kg)  Height: 5\' 8"  (1.727 m)    General:  WDWN in NAD; vital signs documented  above Gait: Normal HENT: WNL, normocephalic Pulmonary: normal non-labored breathing , without wheezing Cardiac: regular HR Abdomen: soft, protuberant, NT, no masses. No palpable AA pulse Vascular Exam/Pulses: 2+ radial, 2+ AT pulses Extremities: without  ischemic changes, without Gangrene , without cellulitis; without open wounds;  Musculoskeletal: no muscle wasting or atrophy  Neurologic: A&O X 3 Psychiatric:  The pt has Normal affect.   Non-Invasive Vascular Imaging:   Abdominal Aorta Findings:    Celiac artery prox 204/48 cm/s  Distal celiac aneurysm 1.53 x 1.63 cm.   Endovascular Aortic Repair (EVAR):  +----------+----------------+-------------------+-------------------+           Diameter AP (cm)Diameter Trans (cm)Velocities (cm/sec)  +----------+----------------+-------------------+-------------------+  Aorta    4.82            4.76               63                   +----------+----------------+-------------------+-------------------+  Right Limb1.89            1.93               42                   +----------+----------------+-------------------+-------------------+  Left Limb 1.61            1.57               42                   +----------+----------------+-------------------+-------------------+   Summary:  Abdominal Aorta: Celiac artery aneurysm 1.53 x 1.63 cm. The largest aortic diameter has decreased compared to prior exam. Previous diameter measurement was obtained on 06/08/22: 5.25 x 5.08 cm.     ASSESSMENT/PLAN:: 78 y.o. male here for follow up for AAA s/p EVAR in May of 2023 by Dr. Myra Gianotti. 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 Has been stable on duplex evaluation. He is without any associated symptoms. Duplex shows that aortic size continues to decrease compared to prior exams. Largest diameter is 4.82 cm today. Celiac artery is essentially unchanged. Largest diameter is 1.63 cm. - Continue Aspirin and Statin - He will follow up in 1 year with repeat EVAR duplex and evaluation of celiac artery aneurysm   Graceann Congress, PA-C [facility] [phone]  Clinic MD:   Myra Gianotti

## 2023-07-06 ENCOUNTER — Ambulatory Visit: Payer: Medicare HMO | Attending: Physician Assistant | Admitting: Physician Assistant

## 2023-07-06 ENCOUNTER — Encounter: Payer: Self-pay | Admitting: Physician Assistant

## 2023-07-06 VITALS — BP 140/90 | HR 59 | Ht 68.0 in | Wt 222.4 lb

## 2023-07-06 DIAGNOSIS — E7800 Pure hypercholesterolemia, unspecified: Secondary | ICD-10-CM

## 2023-07-06 DIAGNOSIS — I2510 Atherosclerotic heart disease of native coronary artery without angina pectoris: Secondary | ICD-10-CM

## 2023-07-06 DIAGNOSIS — I10 Essential (primary) hypertension: Secondary | ICD-10-CM | POA: Diagnosis not present

## 2023-07-06 DIAGNOSIS — I7143 Infrarenal abdominal aortic aneurysm, without rupture: Secondary | ICD-10-CM

## 2023-07-06 HISTORY — DX: Atherosclerotic heart disease of native coronary artery without angina pectoris: I25.10

## 2023-07-06 MED ORDER — VALSARTAN 160 MG PO TABS: 160.0000 mg | ORAL_TABLET | Freq: Every day | ORAL | 3 refills | Status: AC

## 2023-07-06 NOTE — Assessment & Plan Note (Signed)
 LDL cholesterol above goal at 82 in 12/2022. Simvastatin was increased at that time. Goal LDL < 55.  - Continue simvastatin 40 mg daily - Get CMET and lipid panel today

## 2023-07-06 NOTE — Assessment & Plan Note (Signed)
 He is not having chest symptoms to suggest angina  - Continue aspirin 81 mg daily - Continue simvastatin 40 mg daily

## 2023-07-06 NOTE — Assessment & Plan Note (Signed)
 Status post endovascular aneurysm repair (EVAR) in May 2023.   - Continue follow-up with vascular surgery as planned

## 2023-07-06 NOTE — Progress Notes (Signed)
 Cardiology Office Note:    Date:  07/06/2023  ID:  Scott Hill, DOB [birth year], MRN [id number] PCP: Sigmund Hazel, MD  [facility] Providers Cardiologist:  Orbie Pyo, MD       Patient Profile:      Coronary artery Ca2+ (CT in 05/2021) Lexiscan MPI 07/16/21: no ischemia or infarction, EF 48; low risk  TTE 07/16/21: EF 60-65, no RWMA, LVH, Gr 1 DD, NL RVSF, trivial MR Right Bundle Branch Block  Abdominal aortic aneurysm s/p EVAR 07/2021 Hypertension  Hyperlipidemia  Goal LDL < 55 Lp(a) < 8.4  Aortic atherosclerosis  Prostate CA         Discussed the use of AI scribe software for clinical note transcription with the patient, who gave verbal consent to proceed.  History of Present Illness Scott Hill is a 78 y.o. male who returns for follow up of CAD. He was last seen by Dr. Lynnette Caffey in 12/2022.   He is here alone. He has not had chest pain, pressure, tightness, shortness of breath, dizziness, leg swelling, or orthopnea. He uses a CPAP machine for sleep apnea, which he finds beneficial, although he occasionally removes it during the night. He can fall asleep quickly with the CPAP and feels it improves his sleep quality. Socially, he smokes cigars but does not smoke cigarettes. He has reduced his alcohol consumption since retiring from his job in [place].   Review of Systems  Gastrointestinal:  Negative for hematochezia and Hill.  Genitourinary:  Negative for hematuria.  -See HPI     Studies Reviewed:       Results LABS Total cholesterol: 150 (01/11/2023) HDL: 39 (01/11/2023) LDL: 82 (01/11/2023) Triglycerides: 162 (01/11/2023) Hemoglobin: 14.1 (01/11/2023) ALT: 11 (01/11/2023)    Risk Assessment/Calculations:     HYPERTENSION CONTROL Vitals:   07/06/23 1318 07/06/23 1345  BP: (!) 154/80 (!) 140/90    The patient's blood pressure is elevated above target today.  In order to address the patient's elevated BP: A new medication was  prescribed today.          Physical Exam:   VS:  BP (!) 140/90   Pulse (!) 59   Ht 5\' 8"  (1.727 m)   Wt 222 lb 6.4 oz (100.9 kg)   SpO2 98%   BMI 33.82 kg/m    Wt Readings from Last 3 Encounters:  07/06/23 222 lb 6.4 oz (100.9 kg)  06/14/23 221 lb (100.2 kg)  01/04/23 220 lb 3.2 oz (99.9 kg)    Constitutional:      Appearance: Healthy appearance. Not in distress.  Neck:     Vascular: No carotid bruit. JVD normal.  Pulmonary:     Breath sounds: Normal breath sounds. No wheezing. No rales.  Cardiovascular:     Normal rate. Regular rhythm.     Murmurs: There is no murmur.  Edema:    Peripheral edema absent.  Abdominal:     Palpations: Abdomen is soft.        Assessment and Plan:   Assessment & Plan Coronary artery calcification seen on CT scan He is not having chest symptoms to suggest angina  - Continue aspirin 81 mg daily - Continue simvastatin 40 mg daily Infrarenal abdominal aortic aneurysm (AAA) without rupture (HCC) Status post endovascular aneurysm repair (EVAR) in May 2023.   - Continue follow-up with vascular surgery as planned Essential hypertension Blood pressure remains above goal. We discussed importance of good BP control given hx of abdominal aortic aneurysm. -  Discontinue losartan - Start valsartan 160 mg daily - Continue Metoprolol succinate 50 mg once daily  - Send blood pressure readings in a few weeks - Check C-Met today Hypercholesteremia LDL cholesterol above goal at 82 in 12/2022. Simvastatin was increased at that time. Goal LDL < 55.  - Continue simvastatin 40 mg daily - Get CMET and lipid panel today      Dispo:  Return in about 6 months (around 01/05/2024) for Routine Follow Up, w/ Tereso Newcomer, PA-C.  Signed, Tereso Newcomer, PA-C

## 2023-07-06 NOTE — Patient Instructions (Signed)
 Medication Instructions:  Your physician has recommended you make the following change in your medication:   STOP Losartan'  START Valsartan 160 taking 1 daily  *If you need a refill on your cardiac medications before your next appointment, please call your pharmacy*  Lab Work: TODAY:  CMET & LIPID  If you have labs (blood work) drawn today and your tests are completely normal, you will receive your results only by: MyChart Message (if you have MyChart) OR A paper copy in the mail If you have any lab test that is abnormal or we need to change your treatment, we will call you to review the results.  Testing/Procedures: None ordered  Follow-Up: At [facility], you and your health needs are our priority.  As part of our continuing mission to provide you with exceptional heart care, our providers are all part of one team.  This team includes your primary Cardiologist (physician) and Advanced Practice Providers or APPs (Physician Assistants and Nurse Practitioners) who all work together to provide you with the care you need, when you need it.  Your next appointment:   6 month(s)  Provider:   Tereso Newcomer, PA-C         We recommend signing up for the patient portal called "MyChart".  Sign up information is provided on this After Visit Summary.  MyChart is used to connect with patients for Virtual Visits (Telemedicine).  Patients are able to view lab/test results, encounter notes, upcoming appointments, etc.  Non-urgent messages can be sent to your provider as well.   To learn more about what you can do with MyChart, go to [email or web address].   Other Instructions       1st Floor: - Lobby - Registration  - Pharmacy  - Lab - Cafe  2nd Floor: - PV Lab - Diagnostic Testing (echo, CT, nuclear med)  3rd Floor: - Vacant  4th Floor: - TCTS (cardiothoracic surgery) - AFib Clinic - Structural Heart Clinic - Vascular Surgery  - Vascular Ultrasound  5th Floor: -  HeartCare Cardiology (general and EP) - Clinical Pharmacy for coumadin, hypertension, lipid, weight-loss medications, and med management appointments    Valet parking services will be available as well.

## 2023-07-06 NOTE — Assessment & Plan Note (Signed)
 Blood pressure remains above goal. We discussed importance of good BP control given hx of abdominal aortic aneurysm. - Discontinue losartan - Start valsartan 160 mg daily - Continue Metoprolol succinate 50 mg once daily  - Send blood pressure readings in a few weeks - Check C-Met today

## 2023-07-07 DIAGNOSIS — I10 Essential (primary) hypertension: Secondary | ICD-10-CM | POA: Diagnosis not present

## 2023-07-07 DIAGNOSIS — E7800 Pure hypercholesterolemia, unspecified: Secondary | ICD-10-CM | POA: Diagnosis not present

## 2023-07-07 DIAGNOSIS — I2510 Atherosclerotic heart disease of native coronary artery without angina pectoris: Secondary | ICD-10-CM | POA: Diagnosis not present

## 2023-07-07 DIAGNOSIS — I7143 Infrarenal abdominal aortic aneurysm, without rupture: Secondary | ICD-10-CM | POA: Diagnosis not present

## 2023-07-08 LAB — COMPREHENSIVE METABOLIC PANEL WITH GFR
ALT: 11 IU/L (ref 0–44)
AST: 14 IU/L (ref 0–40)
Albumin: 4.5 g/dL (ref 3.8–4.8)
Alkaline Phosphatase: 114 IU/L (ref 44–121)
BUN/Creatinine Ratio: 14 (ref 10–24)
BUN: 18 mg/dL (ref 8–27)
Bilirubin Total: 0.9 mg/dL (ref 0.0–1.2)
CO2: 24 mmol/L (ref 20–29)
Calcium: 9.1 mg/dL (ref 8.6–10.2)
Chloride: 104 mmol/L (ref 96–106)
Creatinine, Ser: 1.27 mg/dL (ref 0.76–1.27)
Globulin, Total: 1.7 g/dL (ref 1.5–4.5)
Glucose: 146 mg/dL — ABNORMAL HIGH (ref 70–99)
Potassium: 5 mmol/L (ref 3.5–5.2)
Sodium: 141 mmol/L (ref 134–144)
Total Protein: 6.2 g/dL (ref 6.0–8.5)
eGFR: 59 mL/min/{1.73_m2} — ABNORMAL LOW (ref >59)

## 2023-07-08 LAB — LIPID PANEL
Chol/HDL Ratio: 3.7 ratio (ref 0.0–5.0)
Cholesterol, Total: 158 mg/dL (ref 100–199)
HDL: 43 mg/dL (ref >39)
LDL Chol Calc (NIH): 92 mg/dL (ref 0–99)
Triglycerides: 126 mg/dL (ref 0–149)
VLDL Cholesterol Cal: 23 mg/dL (ref 5–40)

## 2023-07-12 ENCOUNTER — Other Ambulatory Visit (HOSPITAL_BASED_OUTPATIENT_CLINIC_OR_DEPARTMENT_OTHER): Payer: Self-pay | Admitting: *Deleted

## 2023-07-12 DIAGNOSIS — E7800 Pure hypercholesterolemia, unspecified: Secondary | ICD-10-CM

## 2023-07-12 DIAGNOSIS — I700 Atherosclerosis of aorta: Secondary | ICD-10-CM

## 2023-07-12 DIAGNOSIS — E785 Hyperlipidemia, unspecified: Secondary | ICD-10-CM

## 2023-07-12 DIAGNOSIS — I2510 Atherosclerotic heart disease of native coronary artery without angina pectoris: Secondary | ICD-10-CM

## 2023-07-12 MED ORDER — EZETIMIBE 10 MG PO TABS
10.0000 mg | ORAL_TABLET | Freq: Every day | ORAL | 3 refills | Status: AC
Start: 2023-07-12 — End: 2023-10-10

## 2023-12-28 ENCOUNTER — Ambulatory Visit: Admitting: Physician Assistant

## 2024-01-25 DIAGNOSIS — E669 Obesity, unspecified: Secondary | ICD-10-CM | POA: Diagnosis not present

## 2024-01-25 DIAGNOSIS — Z1331 Encounter for screening for depression: Secondary | ICD-10-CM | POA: Diagnosis not present

## 2024-01-25 DIAGNOSIS — Z6832 Body mass index (BMI) 32.0-32.9, adult: Secondary | ICD-10-CM | POA: Diagnosis not present

## 2024-01-25 DIAGNOSIS — Z0000 Encounter for general adult medical examination without abnormal findings: Secondary | ICD-10-CM | POA: Diagnosis not present

## 2024-01-25 DIAGNOSIS — Z8546 Personal history of malignant neoplasm of prostate: Secondary | ICD-10-CM | POA: Diagnosis not present

## 2024-01-25 DIAGNOSIS — E7800 Pure hypercholesterolemia, unspecified: Secondary | ICD-10-CM | POA: Diagnosis not present

## 2024-01-25 DIAGNOSIS — I129 Hypertensive chronic kidney disease with stage 1 through stage 4 chronic kidney disease, or unspecified chronic kidney disease: Secondary | ICD-10-CM | POA: Diagnosis not present

## 2024-01-25 DIAGNOSIS — G4733 Obstructive sleep apnea (adult) (pediatric): Secondary | ICD-10-CM | POA: Diagnosis not present

## 2024-01-25 DIAGNOSIS — N1831 Chronic kidney disease, stage 3a: Secondary | ICD-10-CM | POA: Diagnosis not present

## 2024-01-25 DIAGNOSIS — Z9889 Other specified postprocedural states: Secondary | ICD-10-CM | POA: Diagnosis not present

## 2024-03-02 ENCOUNTER — Encounter: Payer: Self-pay | Admitting: *Deleted

## 2024-03-02 NOTE — Progress Notes (Signed)
 Scott Hill                                          MRN: [id number]   03/02/2024   The VBCI Quality Team Specialist reviewed this patient medical record for the purposes of chart review for care gap closure. The following were reviewed: chart review for care gap closure-controlling blood pressure.    VBCI Quality Team

## 2024-03-03 ENCOUNTER — Other Ambulatory Visit: Payer: Self-pay | Admitting: Internal Medicine

## 2024-03-03 DIAGNOSIS — I700 Atherosclerosis of aorta: Secondary | ICD-10-CM

## 2024-03-03 DIAGNOSIS — E785 Hyperlipidemia, unspecified: Secondary | ICD-10-CM

## 2024-03-06 DIAGNOSIS — E785 Hyperlipidemia, unspecified: Secondary | ICD-10-CM | POA: Diagnosis not present

## 2024-03-06 DIAGNOSIS — Z6833 Body mass index (BMI) 33.0-33.9, adult: Secondary | ICD-10-CM | POA: Diagnosis not present

## 2024-03-06 DIAGNOSIS — I129 Hypertensive chronic kidney disease with stage 1 through stage 4 chronic kidney disease, or unspecified chronic kidney disease: Secondary | ICD-10-CM | POA: Diagnosis not present
